# Patient Record
Sex: Male | Born: 1940 | Race: White | Hispanic: No | Marital: Married | Smoking: Never smoker
Health system: Southern US, Community
[De-identification: ages and names within clinical notes are randomized; demographics above are authoritative.]

## PROBLEM LIST (undated history)

## (undated) DIAGNOSIS — K259 Gastric ulcer, unspecified as acute or chronic, without hemorrhage or perforation: Secondary | ICD-10-CM

## (undated) DIAGNOSIS — E785 Hyperlipidemia, unspecified: Secondary | ICD-10-CM

## (undated) DIAGNOSIS — E669 Obesity, unspecified: Secondary | ICD-10-CM

## (undated) DIAGNOSIS — E119 Type 2 diabetes mellitus without complications: Secondary | ICD-10-CM

## (undated) DIAGNOSIS — L570 Actinic keratosis: Secondary | ICD-10-CM

## (undated) HISTORY — DX: Gastric ulcer, unspecified as acute or chronic, without hemorrhage or perforation: K25.9

## (undated) HISTORY — DX: Hyperlipidemia, unspecified: E78.5

## (undated) HISTORY — PX: TONSILLECTOMY: SUR1361

## (undated) HISTORY — DX: Actinic keratosis: L57.0

## (undated) HISTORY — PX: TREATMENT FISTULA ANAL: SUR1390

## (undated) HISTORY — PX: KNEE ARTHROSCOPY: SHX127

---
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
## 2003-03-18 ENCOUNTER — Other Ambulatory Visit: Payer: Self-pay

## 2007-02-02 ENCOUNTER — Emergency Department: Payer: Self-pay | Admitting: Emergency Medicine

## 2007-02-13 ENCOUNTER — Emergency Department: Payer: Self-pay | Admitting: Unknown Physician Specialty

## 2008-06-22 ENCOUNTER — Ambulatory Visit: Payer: Self-pay | Admitting: Specialist

## 2008-07-14 ENCOUNTER — Ambulatory Visit: Payer: Self-pay | Admitting: Specialist

## 2008-07-22 ENCOUNTER — Ambulatory Visit: Payer: Self-pay | Admitting: Specialist

## 2013-05-12 DIAGNOSIS — C4492 Squamous cell carcinoma of skin, unspecified: Secondary | ICD-10-CM

## 2013-05-12 HISTORY — DX: Squamous cell carcinoma of skin, unspecified: C44.92

## 2017-01-24 ENCOUNTER — Other Ambulatory Visit: Payer: Self-pay | Admitting: Internal Medicine

## 2017-01-24 DIAGNOSIS — M5416 Radiculopathy, lumbar region: Secondary | ICD-10-CM

## 2017-01-26 ENCOUNTER — Other Ambulatory Visit: Payer: Self-pay | Admitting: Internal Medicine

## 2017-01-26 ENCOUNTER — Ambulatory Visit
Admission: RE | Admit: 2017-01-26 | Discharge: 2017-01-26 | Disposition: A | Discharge status: Home / Self Care | Payer: Medicare Other | Source: Ambulatory Visit | Attending: Internal Medicine | Admitting: Internal Medicine

## 2017-01-26 DIAGNOSIS — M5416 Radiculopathy, lumbar region: Secondary | ICD-10-CM | POA: Diagnosis present

## 2017-01-26 DIAGNOSIS — M48061 Spinal stenosis, lumbar region without neurogenic claudication: Secondary | ICD-10-CM | POA: Diagnosis not present

## 2017-01-26 DIAGNOSIS — M2578 Osteophyte, vertebrae: Secondary | ICD-10-CM | POA: Diagnosis not present

## 2017-01-26 DIAGNOSIS — M4186 Other forms of scoliosis, lumbar region: Secondary | ICD-10-CM | POA: Diagnosis not present

## 2017-01-26 DIAGNOSIS — M5126 Other intervertebral disc displacement, lumbar region: Secondary | ICD-10-CM | POA: Diagnosis not present

## 2017-01-26 LAB — POCT I-STAT CREATININE: CREATININE: 1.6 mg/dL — AB (ref 0.61–1.24)

## 2017-02-04 ENCOUNTER — Ambulatory Visit
Admission: RE | Admit: 2017-02-04 | Discharge: 2017-02-04 | Disposition: A | Discharge status: Home / Self Care | Payer: Medicare Other | Source: Ambulatory Visit | Attending: Student in an Organized Health Care Education/Training Program | Admitting: Student in an Organized Health Care Education/Training Program

## 2017-02-04 ENCOUNTER — Encounter: Payer: Self-pay | Admitting: Student in an Organized Health Care Education/Training Program

## 2017-02-04 ENCOUNTER — Ambulatory Visit
Payer: Medicare Other | Attending: Student in an Organized Health Care Education/Training Program | Admitting: Student in an Organized Health Care Education/Training Program

## 2017-02-04 VITALS — BP 156/95 | HR 67 | Temp 97.8°F | Resp 24 | Ht 68.0 in | Wt 230.0 lb

## 2017-02-04 DIAGNOSIS — M48061 Spinal stenosis, lumbar region without neurogenic claudication: Secondary | ICD-10-CM | POA: Diagnosis not present

## 2017-02-04 DIAGNOSIS — M5416 Radiculopathy, lumbar region: Secondary | ICD-10-CM | POA: Insufficient documentation

## 2017-02-04 DIAGNOSIS — M47816 Spondylosis without myelopathy or radiculopathy, lumbar region: Secondary | ICD-10-CM | POA: Insufficient documentation

## 2017-02-04 DIAGNOSIS — M545 Low back pain: Secondary | ICD-10-CM | POA: Diagnosis present

## 2017-02-04 DIAGNOSIS — M5116 Intervertebral disc disorders with radiculopathy, lumbar region: Secondary | ICD-10-CM | POA: Insufficient documentation

## 2017-02-04 DIAGNOSIS — M4726 Other spondylosis with radiculopathy, lumbar region: Secondary | ICD-10-CM | POA: Diagnosis present

## 2017-02-04 DIAGNOSIS — M5441 Lumbago with sciatica, right side: Secondary | ICD-10-CM | POA: Insufficient documentation

## 2017-02-04 MED ORDER — SODIUM CHLORIDE 0.9 % IJ SOLN
INTRAMUSCULAR | Status: AC
  Filled 2017-02-04: qty 10

## 2017-02-04 MED ORDER — IOPAMIDOL (ISOVUE-M 200) INJECTION 41%
10.0000 mL | Freq: Once | INTRAMUSCULAR | Status: AC
  Administered 2017-02-04: 20 mL via EPIDURAL
  Filled 2017-02-04: qty 10

## 2017-02-04 MED ORDER — LIDOCAINE HCL (PF) 1 % IJ SOLN
10.0000 mL | Freq: Once | INTRAMUSCULAR | Status: AC
  Administered 2017-02-04: 5 mL
  Filled 2017-02-04: qty 10

## 2017-02-04 MED ORDER — ROPIVACAINE HCL 2 MG/ML IJ SOLN
2.0000 mL | Freq: Once | INTRAMUSCULAR | Status: AC
  Administered 2017-02-04: 10 mL via EPIDURAL
  Filled 2017-02-04: qty 10

## 2017-02-04 MED ORDER — DEXAMETHASONE SODIUM PHOSPHATE 10 MG/ML IJ SOLN
10.0000 mg | Freq: Once | INTRAMUSCULAR | Status: AC
  Administered 2017-02-04: 10 mg
  Filled 2017-02-04: qty 1

## 2017-02-04 MED ORDER — SODIUM CHLORIDE 0.9% FLUSH
2.0000 mL | Freq: Once | INTRAVENOUS | Status: AC
  Administered 2017-02-04: 10 mL

## 2017-02-04 NOTE — Progress Notes (Addendum)
Patient's Name: Larry Shaffer  MRN: [id number]  Referring Provider: Tracie Harrier, MD  DOB: 1940/06/25  PCP: Tracie Harrier, MD  DOS: 02/04/2017  Note by: Gillis Santa, MD  Service setting: Ambulatory outpatient  Specialty: Interventional Pain Management  Location: [facility] (AMB) Pain Management Facility    Patient type: New patient ("FAST-TRACK" Evaluation)   Warning: This referral option does not include the extensive pharmacological evaluation required for [place] to take over the patient's medication management. The "Fast-Track" system is designed to bypass the new patient referral waiting list, as well as the normal patient evaluation process, in order to provide a patient in distress with a timely pain management intervention. Because the system was not designed to unfairly get a patient into our pain practice ahead of those already waiting, certain restrictions apply. By requesting a "Fast-Track" consult, the referring physician has opted to continue managing the patient's medications in order to get interventional urgent care.  Primary Reason for Visit: Interventional Pain Management Treatment. CC: Back Pain (lower right)   Procedure  HPI  Larry Shaffer is a 76 y.o. year old, male patient, who comes today for a  "Fast-Track" new patient evaluation, as requested by Tracie Harrier, MD. The patient has been made aware that this type of referral option is reserved for the Interventional Pain Management portion of our practice and completely excludes the option of medication management. His primarily concern today is the Back Pain (lower right)  Pain Assessment: Location: Lower, Right Back Radiating: down the buttocks and back of the right leg to approx just above the knee Onset: More than a month ago Duration: Chronic pain Quality: Spasm, Sharp, Radiating, Shooting, Constant Severity: 8  (when standing)/10 (self-reported pain score)  Note: Reported level is compatible with observation.                    When using our objective Pain Scale, levels between 6 and 10/10 are said to belong in an emergency room, as it progressively worsens from a 6/10, described as severely limiting, requiring emergency care not usually available at an outpatient pain management facility. At a 6/10 level, communication becomes difficult and requires great effort. Assistance to reach the emergency department may be required. Facial flushing and profuse sweating along with potentially dangerous increases in heart rate and blood pressure will be evident. Effect on ADL: difficult to walk without stooping over.  Timing: Constant Modifying factors: sitting and aleve.   Onset and Duration: Date of onset: 01/11/2017 Cause of pain: lifting heavy boxes at home Severity: Getting worse, NAS-11 at its best: 8/10 and NAS-11 now: 8/10 Timing: Not influenced by the time of the day and After a period of immobility Aggravating Factors: Prolonged standing and Walking Alleviating Factors: Resting, Sitting, Warm showers or baths and Walking Associated Problems: Spasms and Pain that wakes patient up Quality of Pain: Aching, Deep, Disabling, Feeling of constriction, Pressure-like and Stabbing Previous Examinations or Tests: MRI scan and Neurosurgical evaluation Previous Treatments: Steroid treatments by mouth  The patient comes into the clinic today, referred to [place] for a right L4-L5 lumbar epidural steroid injection.  Patient presents with low back pain that radiates to his right buttock and posterior thigh region. Pain started approximately 1 month ago when he was lifting heavy items and felt a sharp for pain sensation in his back. This worsened on his trip to [place] for work. Patient's pain stops in the posterior thigh around his knee and rarely extends below his knee. He  believes that his pain symptoms have improved somewhat over the last month. No bowel or bladder deficits. No obvious weakness.  Patient has tried a  steroid taper along with muscle relaxants, NSAIDs, Tylenol. These were not very helpful. Of note patient did have a flu vaccination the last week. Discussed the potential risks of having an epidural steroid injection done in the context of having a flu shot in the last week. Patient insists that his low back and leg pain is very debilitating and that he is willing to proceed with the epidural steroid injection understanding the risks associated in the context of having received a flu vaccination last week.  Meds   Current Outpatient Prescriptions:  .  atorvastatin (LIPITOR) 20 MG tablet, Take 1 tablet by mouth daily., Disp: , Rfl:  .  famciclovir (FAMVIR) 500 MG tablet, Take 1 tablet by mouth 2 (two) times daily as needed., Disp: , Rfl:  .  glipiZIDE (GLUCOTROL XL) 10 MG 24 hr tablet, Take 10 mg by mouth daily., Disp: , Rfl:  .  pantoprazole (PROTONIX) 40 MG tablet, Take 1 tablet by mouth daily., Disp: , Rfl:  .  sucralfate (CARAFATE) 1 g tablet, Take 1 g by mouth 4 (four) times daily., Disp: , Rfl:  .  tamsulosin (FLOMAX) 0.4 MG CAPS capsule, Take 0.4 mg by mouth daily., Disp: , Rfl:   Imaging Review   Lumbosacral Imaging: Lumbar MR wo contrast:  Results for orders placed during the hospital encounter of 01/26/17  MR LUMBAR SPINE WO CONTRAST   Narrative CLINICAL DATA:  Lifting injury 2 weeks ago. Low back pain radiating into the right hip, groin and posterior thigh.  EXAM: MRI LUMBAR SPINE WITHOUT CONTRAST  TECHNIQUE: Multiplanar, multisequence MR imaging of the lumbar spine was performed. No intravenous contrast was administered.  COMPARISON:  None.  FINDINGS: Segmentation: Conventional anatomy assumed, with the last open disc space designated L5-S1.  Alignment: Mild convex left scoliosis and slight degenerative anterolisthesis at L4-5.  Vertebrae: No worrisome osseous lesion, acute fracture or pars defect. There are scattered endplate degenerative changes. The lumbar  pedicles are somewhat short on a congenital basis. The right sacroiliac joint may be partial ankylosis.  Conus medullaris: Extends to the L1 level and appears normal.  Paraspinal and other soft tissues: No significant paraspinal findings. The bladder appears distended.  Disc levels:  No significant disc space findings from T11-12 through L1-2.  L2-3: Mild loss of disc height with annular disc bulging and endplate osteophytes. No significant spinal stenosis or nerve root encroachment.  L3-4: Advanced loss of disc height with annular disc bulging and endplate osteophytes. There is an extraforaminal disc osteophyte complex on the right causing right L3 nerve root encroachment. There is mild facet and ligamentous hypertrophy. There is borderline spinal stenosis with mild narrowing of the lateral recesses. The left foramen is patent.  L4-5: Loss of disc height with annular disc bulging and moderate to advanced facet and ligamentous hypertrophy. There is resulting moderate multifactorial spinal stenosis with moderate narrowing of the lateral recesses and foramina bilaterally.  L5-S1: Loss of disc height with annular disc bulging and endplate osteophytes asymmetric to the left. Mild facet and ligamentous hypertrophy. The spinal canal and lateral recesses are patent. There is mild right and moderate left foraminal narrowing.  IMPRESSION: 1. Extraforaminal disc protrusion on the right at L3-4 appears to have covering spur but may contribute to right L3 nerve root encroachment and the patient's symptoms. 2. 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 3. Mild right and moderate left foraminal narrowing at L5-S1 secondary to endplate osteophytes.   Electronically Signed   By: Carey Bullocks M.D.   On: 01/26/2017 13:18     Knee-L MR w contrast:  Results for orders placed in visit on 06/22/08  MR Knee  Left  Wo Contrast   Narrative   PRIOR REPORT IMPORTED FROM THE [facility] WORKFLOW SYSTEM   REASON FOR EXAM:    left knee pain  COMMENTS:  [id number]   PROCEDURE:     MR  - MR KNEE LT  WO CONTRAST  - Jun 22 2008  8:28AM   RESULT:     Multiplanar/multisequence imaging of the LEFT knee was  obtained  without the administration of gadolinium.   The subpatellar cartilage is intact. A small/trace subpatella effusion is  identified. A small loculated area of increased T2 signal projects within  the medial popliteal fossa. The osseous structures demonstrate no T1 nor  T2  signal abnormalities. The neurovascular bundle is intact. The medial  collateral ligament demonstrates a small amount of increased T2 signal  deep  to the ligament as well as projecting superficially to the ligament. The  architecture of the ligament is intact. The lateral collateral ligamentous  complex, quadriceps tendon and patellar ligaments are intact. Evaluation  of  the menisci demonstrates an area of increased T1 and T2 signal within the  posterior horn of the medial meniscus demonstrating articular  communication.  The anterior and posterior cruciate ligaments are intact.   IMPRESSION:   1. Findings concerning for a tear within the posterior horn of the medial  meniscus.   2. Small subpatella effusion.   3. Element of meniscocapsular separation within the medial compartment.   Thank you for the opportunity to contribute to the care of your patient.       Complexity Note: Imaging results reviewed. Results shared with Larry Shaffer, using Layman's terms.                         ROS  Cardiovascular History: No reported cardiovascular signs or symptoms such as High blood pressure, coronary artery disease, abnormal heart rate or rhythm, heart attack, blood thinner therapy or heart weakness and/or failure Pulmonary or Respiratory History: Snoring  Neurological History: No reported neurological signs or symptoms  such as seizures, abnormal skin sensations, urinary and/or fecal incontinence, being born with an abnormal open spine and/or a tethered spinal cord Review of Past Neurological Studies: No results found for this or any previous visit. Psychological-Psychiatric History: No reported psychological or psychiatric signs or symptoms such as difficulty sleeping, anxiety, depression, delusions or hallucinations (schizophrenial), mood swings (bipolar disorders) or suicidal ideations or attempts Gastrointestinal History: Vomiting blood (Ulcers) Genitourinary History: No reported renal or genitourinary signs or symptoms such as difficulty voiding or producing urine, peeing blood, non-functioning kidney, kidney stones, difficulty emptying the bladder, difficulty controlling the flow of urine, or chronic kidney disease Hematological History: No reported hematological signs or symptoms such as prolonged bleeding, low or poor functioning platelets, bruising or bleeding easily, hereditary bleeding problems, low energy levels due to low hemoglobin or being anemic Endocrine History: High blood sugar requiring insulin (IDDM) Rheumatologic History: No reported rheumatological signs and symptoms such as fatigue, joint pain, tenderness, swelling, redness, heat, stiffness, decreased range of motion, with or without associated rash Musculoskeletal History: Negative for myasthenia gravis, muscular dystrophy, multiple sclerosis or malignant hyperthermia  Work History: Working full time and Quit going to work on his/her own  Allergies  Larry Shaffer has No Known Allergies.  Laboratory Chemistry  Inflammation Markers (CRP: Acute Phase) (ESR: Chronic Phase) No results found for: CRP, ESRSEDRATE               Renal Function Markers Lab Results  Component Value Date   CREATININE 1.60 (H) 01/26/2017                 Hepatic Function Markers No results found for: AST, ALT, ALBUMIN, ALKPHOS, HCVAB                Electrolytes No results found for: NA, K, CL, CALCIUM, MG               Neuropathy Markers No results found for: [id number]               Bone Pathology Markers No results found for: Hendricks Milo, VD125OH2TOT, [id number], [id number], 25OHVITD1, 25OHVITD2, 25OHVITD3, CALCIUM, TESTOFREE, TESTOSTERONE               Coagulation Parameters No results found for: INR, LABPROT, APTT, PLT               Cardiovascular Markers No results found for: BNP, HGB, HCT               Note: Lab results reviewed.  Alger  Drug: Larry Shaffer  reports that he does not use drugs. Alcohol:  reports that he drinks alcohol. Tobacco:  reports that he has never smoked. He has never used smokeless tobacco. Medical:  has a past medical history of Gastric ulcer and Hyperlipidemia. Family: family history includes Cancer in his sister; Diabetes in his mother; Heart disease in his father; Stroke in his mother.  Past Surgical History:  Procedure Laterality Date  . KNEE ARTHROSCOPY Bilateral 2003, 2004  . tonsillectomty  1947   Active Ambulatory Problems    Diagnosis Date Noted  . Lumbar radiculopathy 02/04/2017  . 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 Spinal stenosis of lumbar region without neurogenic claudication 02/04/2017  . Acute right-sided low back pain with right-sided sciatica 02/04/2017   Resolved Ambulatory Problems    Diagnosis Date Noted  . No Resolved Ambulatory Problems   Past Medical History:  Diagnosis Date  . Gastric ulcer   . Hyperlipidemia    Constitutional Exam  General appearance: Well nourished, well developed, and well hydrated. In no apparent acute distress Vitals:   02/04/17 1306 02/04/17 1310 02/04/17 1314 02/04/17 1316  BP: (!) 166/95 (!) 166/129 (!) 166/100 (!) 156/95  Pulse:      Resp: 19 (!) 22 (!) 21 (!) 24  Temp:      TempSrc:      SpO2: 98% 100% 100% 100%  Weight:      Height:       BMI Assessment: Estimated body mass  index is 34.97 kg/m as calculated from the following:   Height as of this encounter: '5\' 8"'$  (1.727 m).   Weight as of this encounter: 230 lb (104.3 kg).  BMI interpretation table: BMI level Category Range association with higher incidence of chronic pain  <18 kg/m2 Underweight   18.5-24.9 kg/m2 Ideal body weight   25-29.9 kg/m2 Overweight Increased incidence by 20%  30-34.9 kg/m2 Obese (Class I) Increased incidence by 68%  35-39.9 kg/m2 Severe obesity (Class II) Increased incidence by 136%  >40 kg/m2 Extreme obesity (Class III) Increased incidence by  254%   BMI Readings from Last 4 Encounters:  02/04/17 34.97 kg/m   Wt Readings from Last 4 Encounters:  02/04/17 230 lb (104.3 kg)  Psych/Mental status: Alert, oriented x 3 (person, place, & time)       Eyes: PERLA Respiratory: No evidence of acute respiratory distress  Lumbar Spine Area Exam  Skin & Axial Inspection: No masses, redness, or swelling Alignment: Symmetrical Functional ROM: Unrestricted ROM      Stability: No instability detected Muscle Tone/Strength: Functionally intact. No obvious neuro-muscular anomalies detected. Sensory (Neurological): Unimpaired Palpation: No palpable anomalies       Provocative Tests: Lumbar Hyperextension and rotation test: Positive bilaterally for facet joint pain. right greater than left. Lumbar Lateral bending test: Positive ipsilateral radicular pain, bilaterally. Positive for bilateral foraminal stenosis.right greater than left. Patrick's Maneuver: evaluation deferred today                    Gait & Posture Assessment  Ambulation: Limited Gait: Antalgic Posture: WNL   Lower Extremity Exam    Side: Right lower extremity  Side: Left lower extremity  Skin & Extremity Inspection: Skin color, temperature, and hair growth are WNL. No peripheral edema or cyanosis. No masses, redness, swelling, asymmetry, or associated skin lesions. No contractures.  Skin & Extremity Inspection: Skin color,  temperature, and hair growth are WNL. No peripheral edema or cyanosis. No masses, redness, swelling, asymmetry, or associated skin lesions. No contractures.  Functional ROM: Unrestricted ROM          Functional ROM: Unrestricted ROM          Muscle Tone/Strength: Functionally intact. No obvious neuro-muscular anomalies detected.  Muscle Tone/Strength: Functionally intact. No obvious neuro-muscular anomalies detected.  Sensory (Neurological): Unimpaired  Sensory (Neurological): Unimpaired  Palpation: No palpable anomalies  Palpation: No palpable anomalies     Assessment and plan: -76 year old gentleman with history of axial low back pain that radiates into his right leg (right buttock and right posterior thigh stopping at his knee).  Patient's lumbar MRI shows extraforaminal disc protrusion on the right at L3-L4 compressing the right L3 nerve root along with facet and ligamentous hypertrophy. Patient also has moderate multifactorial spinal stenosis at L4-L5 with moderate narrowing of the lateral recess and foramina bilaterally.  Plan today to do interlaminar L4-L5 right-sided epidural steroid injection under fluoroscopy. Patient is not on any blood thinners. Understands risks and benefits of the procedure. See procedure note.

## 2017-02-04 NOTE — Progress Notes (Signed)
Safety precautions to be maintained throughout the outpatient stay will include: orient to surroundings, keep bed in low position, maintain call bell within reach at all times, provide assistance with transfer out of bed and ambulation.  

## 2017-02-04 NOTE — Progress Notes (Signed)
Patient's Name: Larry Shaffer  MRN: [id number]  Referring Provider: Tracie Harrier, MD  DOB: 11-19-1940  PCP: Tracie Harrier, MD  DOS: 02/04/2017  Note by: Gillis Santa, MD  Service setting: Ambulatory outpatient  Specialty: Interventional Pain Management  Patient type: Established  Location: [facility] (AMB) Pain Management Facility  Visit type: Interventional Procedure   Primary Reason for Visit: Interventional Pain Management Treatment. CC: Back Pain (lower right)  Procedure:  Anesthesia, Analgesia, Anxiolysis:  Type: Therapeutic Inter-Laminar Epidural Steroid Injection #1 Region: Lumbar Level: L4-5 Level. Laterality: Right-Sided         Type: Local Anesthesia Local Anesthetic: Lidocaine 1% Route: Infiltration (Thunderbolt/IM) IV Access: Secured Sedation: Meaningful verbal contact was maintained at all times during the procedure  Indication(s): Analgesia and Anxiety   Indications: 1. Lumbar radiculopathy   2. Spondylosis without myelopathy or radiculopathy, lumbar region   3. Spinal stenosis of lumbar region without neurogenic claudication   4. Acute right-sided low back pain with right-sided sciatica    Pain Score: Pre-procedure: 8  (when standing)/10 Post-procedure: 8 /10  Pre-op Assessment:  Larry Shaffer is a 76 y.o. (year old), male patient, seen today for interventional treatment. He  has a past surgical history that includes Knee arthroscopy (Bilateral, 2003, 2004) and tonsillectomty (1947). Larry Shaffer has a current medication list which includes the following prescription(s): atorvastatin, famciclovir, glipizide, pantoprazole, sucralfate, and tamsulosin. His primarily concern today is the Back Pain (lower right)  Initial Vital Signs: Blood pressure (!) 144/77, pulse 67, temperature 97.8 F (36.6 C), temperature source Oral, resp. rate 16, height 5\' 8"  (1.727 m), weight 230 lb (104.3 kg), SpO2 97 %. BMI: Estimated body mass index is 34.97 kg/m as calculated from the following:    Height as of this encounter: 5\' 8"  (1.727 m).   Weight as of this encounter: 230 lb (104.3 kg).  Risk Assessment: Allergies: Reviewed. He has No Known Allergies.  Allergy Precautions: None required Coagulopathies: Reviewed. None identified.  Blood-thinner therapy: None at this time Active Infection(s): Reviewed. None identified. Larry Shaffer is afebrile  Site Confirmation: Larry Shaffer was asked to confirm the procedure and laterality before marking the site Procedure checklist: Completed Consent: Before the procedure and under the influence of no sedative(s), amnesic(s), or anxiolytics, the patient was informed of the treatment options, risks and possible complications. To fulfill our ethical and legal obligations, as recommended by the American Medical Association's Code of Ethics, I have informed the patient of my clinical impression; the nature and purpose of the treatment or procedure; the risks, benefits, and possible complications of the intervention; the alternatives, including doing nothing; the risk(s) and benefit(s) of the alternative treatment(s) or procedure(s); and the risk(s) and benefit(s) of doing nothing. The patient was provided information about the general risks and possible complications associated with the procedure. These may include, but are not limited to: failure to achieve desired goals, infection, bleeding, organ or nerve damage, allergic reactions, paralysis, and death. In addition, the patient was informed of those risks and complications associated to Spine-related procedures, such as failure to decrease pain; infection (i.e.: Meningitis, epidural or intraspinal abscess); bleeding (i.e.: epidural hematoma, subarachnoid hemorrhage, or any other type of intraspinal or peri-dural bleeding); organ or nerve damage (i.e.: Any type of peripheral nerve, nerve root, or spinal cord injury) with subsequent damage to sensory, motor, and/or autonomic systems, resulting in permanent  pain, numbness, and/or weakness of one or several areas of the body; allergic reactions; (i.e.: anaphylactic reaction); and/or death. Furthermore, the patient was  informed of those risks and complications associated with the medications. These include, but are not limited to: allergic reactions (i.e.: anaphylactic or anaphylactoid reaction(s)); adrenal axis suppression; blood sugar elevation that in diabetics may result in ketoacidosis or comma; water retention that in patients with history of congestive heart failure may result in shortness of breath, pulmonary edema, and decompensation with resultant heart failure; weight gain; swelling or edema; medication-induced neural toxicity; particulate matter embolism and blood vessel occlusion with resultant organ, and/or nervous system infarction; and/or aseptic necrosis of one or more joints. Finally, the patient was informed that Medicine is not an exact science; therefore, there is also the possibility of unforeseen or unpredictable risks and/or possible complications that may result in a catastrophic outcome. The patient indicated having understood very clearly. We have given the patient no guarantees and we have made no promises. Enough time was given to the patient to ask questions, all of which were answered to the patient's satisfaction. Larry Shaffer has indicated that he wanted to continue with the procedure. Attestation: I, the ordering provider, attest that I have discussed with the patient the benefits, risks, side-effects, alternatives, likelihood of achieving goals, and potential problems during recovery for the procedure that I have provided informed consent. Date: 02/04/2017; Time: 12:43 PM  Pre-Procedure Preparation:  Monitoring: As per clinic protocol. Respiration, ETCO2, SpO2, BP, heart rate and rhythm monitor placed and checked for adequate function Safety Precautions: Patient was assessed for positional comfort and pressure points before  starting the procedure. Time-out: I initiated and conducted the "Time-out" before starting the procedure, as per protocol. The patient was asked to participate by confirming the accuracy of the "Time Out" information. Verification of the correct person, site, and procedure were performed and confirmed by me, the nursing staff, and the patient. "Time-out" conducted as per Joint Commission's Universal Protocol (UP.01.01.01). "Time-out" Date & Time: 02/04/2017; 1258 hrs.  Description of Procedure Process:   Position: Prone with head of the table was raised to facilitate breathing. Target Area: The interlaminar space, initially targeting the lower laminar border of the superior vertebral body. Approach: Paramedial approach. Area Prepped: Entire Posterior Lumbar Region Prepping solution: ChloraPrep (2% chlorhexidine gluconate and 70% isopropyl alcohol) Safety Precautions: Aspiration looking for blood return was conducted prior to all injections. At no point did we inject any substances, as a needle was being advanced. No attempts were made at seeking any paresthesias. Safe injection practices and needle disposal techniques used. Medications properly checked for expiration dates. SDV (single dose vial) medications used. Description of the Procedure: Protocol guidelines were followed. The procedure needle was introduced through the skin, ipsilateral to the reported pain, and advanced to the target area. Bone was contacted and the needle walked caudad, until the lamina was cleared. The epidural space was identified using "loss-of-resistance technique" with 2-3 ml of PF-NaCl (0.9% NSS), in a 5cc LOR glass syringe. Vitals:   02/04/17 1306 02/04/17 1310 02/04/17 1314 02/04/17 1316  BP: (!) 166/95 (!) 166/129 (!) 166/100 (!) 156/95  Pulse:      Resp: 19 (!) 22 (!) 21 (!) 24  Temp:      TempSrc:      SpO2: 98% 100% 100% 100%  Weight:      Height:        Start Time: 1258 hrs. End Time: 1316 hrs. Materials:   Needle(s) Type: Epidural needle Gauge: 17G Length: 3.5-in Medication(s): We administered iopamidol, ropivacaine (PF) 2 mg/mL (0.2%), sodium chloride flush, lidocaine (PF), and dexamethasone. Please see  chart orders for dosing details. 5 cc of preservative-free normal saline, 2 cc of 0.2% ropivacaine, 1 cc of Decadron 10 mg/cc: 8 cc total Imaging Guidance (Spinal):  Type of Imaging Technique: Fluoroscopy Guidance (Spinal) Indication(s): Assistance in needle guidance and placement for procedures requiring needle placement in or near specific anatomical locations not easily accessible without such assistance. Exposure Time: Please see nurses notes. Contrast: Before injecting any contrast, we confirmed that the patient did not have an allergy to iodine, shellfish, or radiological contrast. Once satisfactory needle placement was completed at the desired level, radiological contrast was injected. Contrast injected under live fluoroscopy. No contrast complications. See chart for type and volume of contrast used. Fluoroscopic Guidance: I was personally present during the use of fluoroscopy. "Tunnel Vision Technique" used to obtain the best possible view of the target area. Parallax error corrected before commencing the procedure. "Direction-depth-direction" technique used to introduce the needle under continuous pulsed fluoroscopy. Once target was reached, antero-posterior, oblique, and lateral fluoroscopic projection used confirm needle placement in all planes. Images permanently stored in EMR. Interpretation: I personally interpreted the imaging intraoperatively. Adequate needle placement confirmed in multiple planes. Appropriate spread of contrast into desired area was observed. No evidence of afferent or efferent intravascular uptake. No intrathecal or subarachnoid spread observed. Permanent images saved into the patient's record.  Antibiotic Prophylaxis:  Indication(s): None identified Antibiotic  given: None  Post-operative Assessment:  EBL: None Complications: No immediate post-treatment complications observed by team, or reported by patient. Note: The patient tolerated the entire procedure well. A repeat set of vitals were taken after the procedure and the patient was kept under observation following institutional policy, for this type of procedure. Post-procedural neurological assessment was performed, showing return to baseline, prior to discharge. The patient was provided with post-procedure discharge instructions, including a section on how to identify potential problems. Should any problems arise concerning this procedure, the patient was given instructions to immediately contact us, at any time, without hesitation. In any case, we plan to contact the patient by telephone for a follow-up status report regarding this interventional procedure. Comments:  No additional relevant information. 5 out of 5 strength bilateral lower extremity: Plantar flexion, dorsiflexion, knee flexion, knee extension. Plan of Care    Imaging Orders     DG C-Arm 1-60 Min-No Report Procedure Orders    No procedure(s) ordered today    Medications ordered for procedure: Meds ordered this encounter  Medications  . iopamidol (ISOVUE-M) 41 % intrathecal injection 10 mL  . ropivacaine (PF) 2 mg/mL (0.2%) (NAROPIN) injection 2 mL  . sodium chloride flush (NS) 0.9 % injection 2 mL  . lidocaine (PF) (XYLOCAINE) 1 % injection 10 mL  . dexamethasone (DECADRON) injection 10 mg   Medications administered: We administered iopamidol, ropivacaine (PF) 2 mg/mL (0.2%), sodium chloride flush, lidocaine (PF), and dexamethasone.  See the medical record for exact dosing, route, and time of administration.  New Prescriptions   No medications on file   Disposition: Discharge home  Discharge Date & Time: 02/04/2017; 1328 hrs.   Physician-requested Follow-up: Return in about 2 weeks (around 02/18/2017) for Post  Procedure Evaluation. Future Appointments Date Time Provider [place]  03/07/2017 11:30 AM Gillis Santa, MD [facility] None   Primary Care Physician: Tracie Harrier, MD Location: [facility] Note by: Gillis Santa, MD Date: 02/04/2017; Time: 2:18 PM  Disclaimer:  Medicine is not an exact science. The only guarantee in medicine is that nothing is guaranteed. It is important  to note that the decision to proceed with this intervention was based on the information collected from the patient. The Data and conclusions were drawn from the patient's questionnaire, the interview, and the physical examination. Because the information was provided in large part by the patient, it cannot be guaranteed that it has not been purposely or unconsciously manipulated. Every effort has been made to obtain as much relevant data as possible for this evaluation. It is important to note that the conclusions that lead to this procedure are derived in large part from the available data. Always take into account that the treatment will also be dependent on availability of resources and existing treatment guidelines, considered by other Pain Management Practitioners as being common knowledge and practice, at the time of the intervention. For Medico-Legal purposes, it is also important to point out that variation in procedural techniques and pharmacological choices are the acceptable norm. The indications, contraindications, technique, and results of the above procedure should only be interpreted and judged by a Board-Certified Interventional Pain Specialist with extensive familiarity and expertise in the same exact procedure and technique.

## 2017-02-04 NOTE — Patient Instructions (Signed)

## 2017-03-07 ENCOUNTER — Ambulatory Visit: Payer: Medicare Other | Admitting: Student in an Organized Health Care Education/Training Program

## 2017-03-11 ENCOUNTER — Ambulatory Visit
Payer: Medicare Other | Attending: Student in an Organized Health Care Education/Training Program | Admitting: Student in an Organized Health Care Education/Training Program

## 2017-03-11 ENCOUNTER — Encounter: Payer: Self-pay | Admitting: Student in an Organized Health Care Education/Training Program

## 2017-03-11 ENCOUNTER — Other Ambulatory Visit: Payer: Self-pay

## 2017-03-11 VITALS — BP 152/69 | HR 65 | Temp 97.5°F | Ht 68.0 in | Wt 230.0 lb

## 2017-03-11 DIAGNOSIS — E785 Hyperlipidemia, unspecified: Secondary | ICD-10-CM | POA: Insufficient documentation

## 2017-03-11 DIAGNOSIS — Z8719 Personal history of other diseases of the digestive system: Secondary | ICD-10-CM | POA: Diagnosis not present

## 2017-03-11 DIAGNOSIS — Z7984 Long term (current) use of oral hypoglycemic drugs: Secondary | ICD-10-CM | POA: Diagnosis not present

## 2017-03-11 DIAGNOSIS — Z79899 Other long term (current) drug therapy: Secondary | ICD-10-CM | POA: Diagnosis not present

## 2017-03-11 DIAGNOSIS — M5416 Radiculopathy, lumbar region: Secondary | ICD-10-CM | POA: Diagnosis not present

## 2017-03-11 DIAGNOSIS — M5126 Other intervertebral disc displacement, lumbar region: Secondary | ICD-10-CM | POA: Diagnosis not present

## 2017-03-11 DIAGNOSIS — M48061 Spinal stenosis, lumbar region without neurogenic claudication: Secondary | ICD-10-CM

## 2017-03-11 DIAGNOSIS — M47816 Spondylosis without myelopathy or radiculopathy, lumbar region: Secondary | ICD-10-CM

## 2017-03-11 DIAGNOSIS — M545 Low back pain: Secondary | ICD-10-CM | POA: Diagnosis present

## 2017-03-11 DIAGNOSIS — M4726 Other spondylosis with radiculopathy, lumbar region: Secondary | ICD-10-CM | POA: Insufficient documentation

## 2017-03-11 MED ORDER — BACLOFEN 10 MG PO TABS: 10.0000 mg | ORAL_TABLET | Freq: Three times a day (TID) | ORAL | 0 refills | Status: DC

## 2017-03-11 NOTE — Patient Instructions (Addendum)
1. Schedule for repeat lumbar epidural epidural steroid 2. Baclofen 10 mg up to three times a day as needed for muscle spasms   Preparing for your procedure (without sedation) Instructions: . Oral Intake: Do not eat or drink anything for at least 3 hours prior to your procedure. . Transportation: Unless otherwise stated by your physician, you may drive yourself after the procedure. . Blood Pressure Medicine: Take your blood pressure medicine with a sip of water the morning of the procedure. . Insulin: Take only  of your normal insulin dose. . Preventing infections: Shower with an antibacterial soap the morning of your procedure. . Build-up your immune system: Take 1000 mg of Vitamin C with every meal (3 times a day) the day prior to your procedure. . Pregnancy: If you are pregnant, call and cancel the procedure. . Sickness: If you have a cold, fever, or any active infections, call and cancel the procedure. . Arrival: You must be in the facility at least 30 minutes prior to your scheduled procedure. . Children: Do not bring any children with you. . Dress appropriately: Bring dark clothing that you would not mind if they get stained. . Valuables: Do not bring any jewelry or valuables. Procedure appointments are reserved for interventional treatments only. Marland Kitchen No Prescription Refills. . No medication changes will be discussed during procedure appointments. . No disability issues will be discussed.

## 2017-03-11 NOTE — Progress Notes (Signed)
Patient's Name: Larry Shaffer  MRN: [id number]  Referring Provider: Tracie Harrier, MD  DOB: [birth year]  PCP: Tracie Harrier, MD  DOS: 03/11/2017  Note by: Gillis Santa, MD  Service setting: Ambulatory outpatient  Specialty: Interventional Pain Management  Location: [facility] (AMB) Pain Management Facility    Patient type: Established   Primary Reason(s) for Visit: Encounter for post-procedure evaluation of chronic illness with mild to moderate exacerbation CC: Tailbone Pain (bilateral ) and Back Pain (base of spine)  HPI  Larry Shaffer is a 76 y.o. year old, male patient, who comes today for a post-procedure evaluation. He has Lumbar radiculopathy; Spondylosis without myelopathy or radiculopathy, lumbar region; Spinal stenosis of lumbar region without neurogenic claudication; and Acute right-sided low back pain with right-sided sciatica on their problem list. His primarily concern today is the Tailbone Pain (bilateral ) and Back Pain (base of spine)  Pain Assessment: Location:   Buttocks(both cheeks) Radiating: down to hamstrings Onset: More than a month ago Duration: Chronic pain Quality: Cramping, Spasm(knots) Severity: 6 /10 (self-reported pain score)  Note: Reported level is inconsistent with clinical observations. Clinically the patient looks like a 3/10 A 3/10 is viewed as "Moderate" and described as significantly interfering with activities of daily living (ADL). It becomes difficult to feed, bathe, get dressed, get on and off the toilet or to perform personal hygiene functions. Difficult to get in and out of bed or a chair without assistance. Very distracting. With effort, it can be ignored when deeply involved in activities.       When using our objective Pain Scale, levels between 6 and 10/10 are said to belong in an emergency room, as it progressively worsens from a 6/10, described as severely limiting, requiring emergency care not usually available at an outpatient pain management  facility. At a 6/10 level, communication becomes difficult and requires great effort. Assistance to reach the emergency department may be required. Facial flushing and profuse sweating along with potentially dangerous increases in heart rate and blood pressure will be evident. Effect on ADL: difficult to walk distance Timing: Constant Modifying factors: epidural, sitting  Larry Shaffer comes in today for post-procedure evaluation after the treatment done on 02/04/2017.  Further details on both, my assessment(s), as well as the proposed treatment plan, please see below.  Post-Procedure Assessment  02/04/2017 Procedure: Right L5-S1 epidural steroid injection Pre-procedure pain score:  8/10 Post-procedure pain score: 8/10         Influential Factors: BMI: 34.97 kg/m Intra-procedural challenges: None observed.         Assessment challenges: None detected.              Reported side-effects: None.        Post-procedural adverse reactions or complications: None reported         Sedation: Please see nurses note. When no sedatives are used, the analgesic levels obtained are directly associated to the effectiveness of the local anesthetics. However, when sedation is provided, the level of analgesia obtained during the initial 1 hour following the intervention, is believed to be the result of a combination of factors. These factors may include, but are not limited to: 1. The effectiveness of the local anesthetics used. 2. The effects of the analgesic(s) and/or anxiolytic(s) used. 3. The degree of discomfort experienced by the patient at the time of the procedure. 4. The patients ability and reliability in recalling and recording the events. 5. The presence and influence of possible secondary gains and/or psychosocial factors. Reported result:  Relief experienced during the 1st hour after the procedure: 100 % (Ultra-Short Term Relief)            Interpretative annotation: Clinically appropriate result.  Analgesia during this period is likely to be Local Anesthetic and/or IV Sedative (Analgesic/Anxiolytic) related.          Effects of local anesthetic: The analgesic effects attained during this period are directly associated to the localized infiltration of local anesthetics and therefore cary significant diagnostic value as to the etiological location, or anatomical origin, of the pain. Expected duration of relief is directly dependent on the pharmacodynamics of the local anesthetic used. Long-acting (4-6 hours) anesthetics used.  Reported result: Relief during the next 4 to 6 hour after the procedure: 100 % (Short-Term Relief)            Interpretative annotation: Clinically appropriate result. Analgesia during this period is likely to be Local Anesthetic-related.          Long-term benefit: Defined as the period of time past the expected duration of local anesthetics (1 hour for short-acting and 4-6 hours for long-acting). With the possible exception of prolonged sympathetic blockade from the local anesthetics, benefits during this period are typically attributed to, or associated with, other factors such as analgesic sensory neuropraxia, antiinflammatory effects, or beneficial biochemical changes provided by agents other than the local anesthetics.  Reported result: Extended relief following procedure: 50 %(4-5 days no pain) (Long-Term Relief)            Interpretative annotation: Clinically appropriate result. Good relief. No permanent benefit expected. Inflammation plays a part in the etiology to the pain.          Current benefits: Defined as reported results that persistent at this point in time.   Analgesia: 0-25 %            Function: Somewhat improved ROM: Somewhat improved Interpretative annotation: Recurrence of symptoms. No permanent benefit expected. Effective diagnostic intervention.          Interpretation: Results would suggest a successful diagnostic intervention.                   Plan:  Please see "Plan of Care" for details.        Patient states significant improvement and axial and lower extremity symptoms for approximately 3-4 days.  Had easier time ambulating and performing ADLs.  After 3 days return of back and leg pain to baseline levels and return of muscle spasms.  Patient states that his low back pain has shifted a little bit more towards the left and extends down to his buttocks and posterior thighs with intermittent sharp shooting pain into his toes provoked by certain movements.  Laboratory Chemistry  Inflammation Markers (CRP: Acute Phase) (ESR: Chronic Phase) No results found for: CRP, ESRSEDRATE               Renal Function Markers Lab Results  Component Value Date   CREATININE 1.60 (H) 01/26/2017                 Hepatic Function Markers No results found for: AST, ALT, ALBUMIN, ALKPHOS, HCVAB               Electrolytes No results found for: NA, K, CL, CALCIUM, MG               Neuropathy Markers No results found for: VITAMINB12               Bone  Pathology Markers No results found for: Hendricks Milo, [id number], [id number], [id number], 25OHVITD1, 25OHVITD2, 25OHVITD3, CALCIUM, TESTOFREE, TESTOSTERONE               Rheumatology Markers No results found for: LABURIC              Coagulation Parameters No results found for: INR, LABPROT, APTT, PLT, DDIMER               Cardiovascular Markers No results found for: BNP, CKTOTAL, CKMB, TROPONINI, HGB, HCT               CA Markers No results found for: CEA, CA125, LABCA2               Note: Lab results reviewed.  Recent Diagnostic Imaging Results  DG C-Arm 1-60 Min-No Report Fluoroscopy was utilized by the requesting physician.  No radiographic  interpretation.   Complexity Note: Imaging results reviewed. Results shared with Larry Shaffer, using Layman's terms.                         Meds   Current Outpatient Medications:  .  atorvastatin (LIPITOR) 20 MG tablet, Take 1 tablet by  mouth daily., Disp: , Rfl:  .  famciclovir (FAMVIR) 500 MG tablet, Take 1 tablet by mouth 2 (two) times daily as needed., Disp: , Rfl:  .  glipiZIDE (GLUCOTROL XL) 10 MG 24 hr tablet, Take 10 mg by mouth daily., Disp: , Rfl:  .  pantoprazole (PROTONIX) 40 MG tablet, Take 1 tablet by mouth daily., Disp: , Rfl:  .  sucralfate (CARAFATE) 1 g tablet, Take 1 g by mouth 4 (four) times daily., Disp: , Rfl:  .  tamsulosin (FLOMAX) 0.4 MG CAPS capsule, Take 0.4 mg by mouth daily., Disp: , Rfl:  .  baclofen (LIORESAL) 10 MG tablet, Take 1 tablet (10 mg total) 3 (three) times daily by mouth., Disp: 90 tablet, Rfl: 0  ROS  Constitutional: Denies any fever or chills Gastrointestinal: No reported hemesis, hematochezia, vomiting, or acute GI distress Musculoskeletal: Denies any acute onset joint swelling, redness, loss of ROM, or weakness Neurological: No reported episodes of acute onset apraxia, aphasia, dysarthria, agnosia, amnesia, paralysis, loss of coordination, or loss of consciousness  Allergies  Larry Shaffer has No Known Allergies.  [place]  Drug: Larry Shaffer  reports that he does not use drugs. Alcohol:  reports that he drinks alcohol. Tobacco:  reports that  has never smoked. he has never used smokeless tobacco. Medical:  has a past medical history of Gastric ulcer and Hyperlipidemia. Surgical: Larry Shaffer  has a past surgical history that includes Knee arthroscopy (Bilateral, 2003, 2004) and tonsillectomty (1947). Family: family history includes Cancer in his sister; Diabetes in his mother; Heart disease in his father; Stroke in his mother.  Constitutional Exam  General appearance: Well nourished, well developed, and well hydrated. In no apparent acute distress Vitals:   03/11/17 1341  BP: (!) 152/69  Pulse: 65  Temp: (!) 97.5 F (36.4 C)  TempSrc: Oral  SpO2: 98%  Weight: 230 lb (104.3 kg)  Height: '5\' 8"'$  (1.727 m)   BMI Assessment: Estimated body mass index is 34.97 kg/m as calculated  from the following:   Height as of this encounter: '5\' 8"'$  (1.727 m).   Weight as of this encounter: 230 lb (104.3 kg).  BMI interpretation table: BMI level Category Range association with higher incidence of chronic pain  <18 kg/m2 Underweight  18.5-24.9 kg/m2 Ideal body weight   25-29.9 kg/m2 Overweight Increased incidence by 20%  30-34.9 kg/m2 Obese (Class I) Increased incidence by 68%  35-39.9 kg/m2 Severe obesity (Class II) Increased incidence by 136%  >40 kg/m2 Extreme obesity (Class III) Increased incidence by 254%   BMI Readings from Last 4 Encounters:  03/11/17 34.97 kg/m  02/04/17 34.97 kg/m   Wt Readings from Last 4 Encounters:  03/11/17 230 lb (104.3 kg)  02/04/17 230 lb (104.3 kg)  Psych/Mental status: Alert, oriented x 3 (person, place, & time)       Eyes: PERLA Respiratory: No evidence of acute respiratory distress  Cervical Spine Area Exam  Skin & Axial Inspection: No masses, redness, edema, swelling, or associated skin lesions Alignment: Symmetrical Functional ROM: Unrestricted ROM      Stability: No instability detected Muscle Tone/Strength: Functionally intact. No obvious neuro-muscular anomalies detected. Sensory (Neurological): Unimpaired Palpation: No palpable anomalies              Upper Extremity (UE) Exam    Side: Right upper extremity  Side: Left upper extremity  Skin & Extremity Inspection: Skin color, temperature, and hair growth are WNL. No peripheral edema or cyanosis. No masses, redness, swelling, asymmetry, or associated skin lesions. No contractures.  Skin & Extremity Inspection: Skin color, temperature, and hair growth are WNL. No peripheral edema or cyanosis. No masses, redness, swelling, asymmetry, or associated skin lesions. No contractures.  Functional ROM: Unrestricted ROM          Functional ROM: Unrestricted ROM          Muscle Tone/Strength: Functionally intact. No obvious neuro-muscular anomalies detected.  Muscle Tone/Strength:  Functionally intact. No obvious neuro-muscular anomalies detected.  Sensory (Neurological): Unimpaired          Sensory (Neurological): Unimpaired          Palpation: No palpable anomalies              Palpation: No palpable anomalies              Specialized Test(s): Deferred         Specialized Test(s): Deferred          Thoracic Spine Area Exam  Skin & Axial Inspection: No masses, redness, or swelling Alignment: Symmetrical Functional ROM: Unrestricted ROM Stability: No instability detected Muscle Tone/Strength: Functionally intact. No obvious neuro-muscular anomalies detected. Sensory (Neurological): Unimpaired Muscle strength & Tone: No palpable anomalies  Lumbar Spine Area Exam  Skin & Axial Inspection: No masses, redness, or swelling Alignment: Symmetrical Functional ROM: Unrestricted ROM      Stability: No instability detected Muscle Tone/Strength: Functionally intact. No obvious neuro-muscular anomalies detected. Sensory (Neurological): Unimpaired Palpation: No palpable anomalies       Provocative Tests: Lumbar Hyperextension and rotation test: Positive bilaterally for facet joint pain. Lumbar Lateral bending test: Positive ipsilateral radicular pain, bilaterally. Positive for bilateral foraminal stenosis. Patrick's Maneuver: evaluation deferred today                    Gait & Posture Assessment  Ambulation: Unassisted Gait: Relatively normal for age and body habitus Posture: WNL   Lower Extremity Exam    Side: Right lower extremity  Side: Left lower extremity  Skin & Extremity Inspection: Skin color, temperature, and hair growth are WNL. No peripheral edema or cyanosis. No masses, redness, swelling, asymmetry, or associated skin lesions. No contractures.  Skin & Extremity Inspection: Skin color, temperature, and hair growth are WNL. No peripheral edema or  cyanosis. No masses, redness, swelling, asymmetry, or associated skin lesions. No contractures.  Functional ROM:  Unrestricted ROM          Functional ROM: Unrestricted ROM          Muscle Tone/Strength: Functionally intact. No obvious neuro-muscular anomalies detected.  Muscle Tone/Strength: Functionally intact. No obvious neuro-muscular anomalies detected.  Sensory (Neurological): Unimpaired  Sensory (Neurological): Unimpaired  Palpation: No palpable anomalies  Palpation: No palpable anomalies   Assessment  Primary Diagnosis & Pertinent Problem List: The primary encounter diagnosis was Lumbar radiculopathy. Diagnoses of Spondylosis without myelopathy or radiculopathy, lumbar region and Spinal stenosis of lumbar region without neurogenic claudication were also pertinent to this visit.  Status Diagnosis  Persistent Stable Stable 1. Lumbar radiculopathy   2. Spondylosis without myelopathy or radiculopathy, lumbar region   3. Spinal stenosis of lumbar region without neurogenic claudication      76 year old gentleman with history of axial low back pain that radiates into his right leg (right buttock and right posterior thigh stopping at his knee).  Patient's lumbar MRI shows extraforaminal disc protrusion on the right at L3-L4 compressing the right L3 nerve root along with facet and ligamentous hypertrophy. Patient also has moderate multifactorial spinal stenosis at L4-L5 with moderate narrowing of the lateral recess and foramina bilaterally.  Patient status post right L5-S1 epidural steroid injection on February 04, 2017 with significant improvement in axial and radicular symptoms for approximately 3-4 days with return of pain and muscle spasms thereafter.  We discussed the risks and benefits of repeating epidural steroid injection #2, patient would like to proceed.  I will also prescribe the patient baclofen for his muscle spasms to take as needed.    Plan: -Lumbar epidural steroid injection #2 directed towards the midline.  -At baclofen 10 mg 3 times daily as needed muscle spasms.  Discontinue tizanidine  given abnormal dreams.   Plan of Care  Pharmacotherapy (Medications Ordered): Meds ordered this encounter  Medications  . baclofen (LIORESAL) 10 MG tablet    Sig: Take 1 tablet (10 mg total) 3 (three) times daily by mouth.    Dispense:  90 tablet    Refill:  0    Do not place this medication, or any other prescription from our practice, on "Automatic Refill". Patient may have prescription filled one day early if pharmacy is closed on scheduled refill date.   Lab-work, procedure(s), and/or referral(s): Orders Placed This Encounter  Procedures  . Lumbar Epidural Injection    Provider-requested follow-up: Return for Procedure.  Future Appointments  Date Time Provider [place]  03/25/2017  9:45 AM Gillis Santa, MD [facility] None    Primary Care Physician: Tracie Harrier, MD Location: [facility] Note by: Gillis Santa, M.D Date: 03/11/2017; Time: 3:36 PM  Patient Instructions  1. Schedule for repeat lumbar epidural epidural steroid 2. Baclofen 10 mg up to three times a day as needed for muscle spasms   Preparing for your procedure (without sedation) Instructions: . Oral Intake: Do not eat or drink anything for at least 3 hours prior to your procedure. . Transportation: Unless otherwise stated by your physician, you may drive yourself after the procedure. . Blood Pressure Medicine: Take your blood pressure medicine with a sip of water the morning of the procedure. . Insulin: Take only  of your normal insulin dose. . Preventing infections: Shower with an antibacterial soap the morning of your procedure. . Build-up your immune system: Take 1000 mg of Vitamin C with every  meal (3 times a day) the day prior to your procedure. . Pregnancy: If you are pregnant, call and cancel the procedure. . Sickness: If you have a cold, fever, or any active infections, call and cancel the procedure. . Arrival: You must be in the facility at least 30  minutes prior to your scheduled procedure. . Children: Do not bring any children with you. . Dress appropriately: Bring dark clothing that you would not mind if they get stained. . Valuables: Do not bring any jewelry or valuables. Procedure appointments are reserved for interventional treatments only. Marland Kitchen No Prescription Refills. . No medication changes will be discussed during procedure appointments. . No disability issues will be discussed.

## 2017-03-11 NOTE — Progress Notes (Signed)
Safety precautions to be maintained throughout the outpatient stay will include: orient to surroundings, keep bed in low position, maintain call bell within reach at all times, provide assistance with transfer out of bed and ambulation.  

## 2017-03-14 ENCOUNTER — Ambulatory Visit: Payer: Medicare Other | Admitting: Student in an Organized Health Care Education/Training Program

## 2017-03-25 ENCOUNTER — Encounter: Payer: Self-pay | Admitting: Student in an Organized Health Care Education/Training Program

## 2017-03-25 ENCOUNTER — Ambulatory Visit (HOSPITAL_BASED_OUTPATIENT_CLINIC_OR_DEPARTMENT_OTHER): Payer: Medicare Other | Admitting: Student in an Organized Health Care Education/Training Program

## 2017-03-25 ENCOUNTER — Other Ambulatory Visit: Payer: Self-pay

## 2017-03-25 ENCOUNTER — Ambulatory Visit
Admission: RE | Admit: 2017-03-25 | Discharge: 2017-03-25 | Disposition: A | Discharge status: Home / Self Care | Payer: Medicare Other | Source: Ambulatory Visit | Attending: Student in an Organized Health Care Education/Training Program | Admitting: Student in an Organized Health Care Education/Training Program

## 2017-03-25 VITALS — BP 145/90 | HR 103 | Temp 98.3°F | Resp 15 | Ht 68.0 in | Wt 236.0 lb

## 2017-03-25 DIAGNOSIS — M5416 Radiculopathy, lumbar region: Secondary | ICD-10-CM

## 2017-03-25 DIAGNOSIS — M48061 Spinal stenosis, lumbar region without neurogenic claudication: Secondary | ICD-10-CM

## 2017-03-25 DIAGNOSIS — M545 Low back pain: Secondary | ICD-10-CM | POA: Insufficient documentation

## 2017-03-25 DIAGNOSIS — M4726 Other spondylosis with radiculopathy, lumbar region: Secondary | ICD-10-CM | POA: Insufficient documentation

## 2017-03-25 DIAGNOSIS — M47816 Spondylosis without myelopathy or radiculopathy, lumbar region: Secondary | ICD-10-CM

## 2017-03-25 MED ORDER — SODIUM CHLORIDE 0.9% FLUSH
2.0000 mL | Freq: Once | INTRAVENOUS | Status: AC
  Administered 2017-03-25: 10 mL

## 2017-03-25 MED ORDER — IOPAMIDOL (ISOVUE-M 200) INJECTION 41%
10.0000 mL | Freq: Once | INTRAMUSCULAR | Status: AC
  Administered 2017-03-25: 10 mL via EPIDURAL
  Filled 2017-03-25: qty 10

## 2017-03-25 MED ORDER — LIDOCAINE HCL (PF) 1 % IJ SOLN
10.0000 mL | Freq: Once | INTRAMUSCULAR | Status: AC
  Administered 2017-03-25: 5 mL
  Filled 2017-03-25: qty 10

## 2017-03-25 MED ORDER — ROPIVACAINE HCL 2 MG/ML IJ SOLN
2.0000 mL | Freq: Once | INTRAMUSCULAR | Status: AC
  Administered 2017-03-25: 10 mL via EPIDURAL
  Filled 2017-03-25: qty 10

## 2017-03-25 MED ORDER — DEXAMETHASONE SODIUM PHOSPHATE 10 MG/ML IJ SOLN
10.0000 mg | Freq: Once | INTRAMUSCULAR | Status: AC
  Administered 2017-03-25: 10 mg
  Filled 2017-03-25: qty 1

## 2017-03-25 NOTE — Patient Instructions (Signed)

## 2017-03-25 NOTE — Progress Notes (Signed)
Patient's Name: Larry Shaffer  MRN: [id number]  Referring Provider: Tracie Harrier, MD  DOB: 12-08-1940  PCP: Tracie Harrier, MD  DOS: 03/25/2017  Note by: Gillis Santa, MD  Service setting: Ambulatory outpatient  Specialty: Interventional Pain Management  Patient type: Established  Location: [facility] (AMB) Pain Management Facility  Visit type: Interventional Procedure   Primary Reason for Visit: Interventional Pain Management Treatment. CC: Back Pain (low)  Procedure:  Anesthesia, Analgesia, Anxiolysis:  Type: Therapeutic Inter-Laminar Epidural Steroid Injection #2 Region: Lumbar Level: L5-S1 Level. Laterality: Right-Sided         Type: Local Anesthesia Local Anesthetic: Lidocaine 1% Route: Infiltration ([place]/IM) IV Access: Declined Sedation: Meaningful verbal contact was maintained at all times during the procedure  Indication(s): Analgesia and Anxiety   Indications: 1. Lumbar radiculopathy   2. Spondylosis without myelopathy or radiculopathy, lumbar region   3. Spinal stenosis of lumbar region without neurogenic claudication    Pain Score: Pre-procedure: 5 /10 Post-procedure: 0-No pain/10  Pre-op Assessment:  [name] is a 76 y.o. (year old), male patient, seen today for interventional treatment. He  has a past surgical history that includes Knee arthroscopy (Bilateral, 2003, 2004) and tonsillectomty (1947). [name] has a current medication list which includes the following prescription(s): atorvastatin, baclofen, famciclovir, glipizide, pantoprazole, sucralfate, and tamsulosin. His primarily concern today is the Back Pain (low)  Initial Vital Signs: Blood pressure (!) 144/77, pulse 67, temperature 97.8 F (36.6 C), temperature source Oral, resp. rate 16, height 5\' 8"  (1.727 m), weight 230 lb (104.3 kg), SpO2 97 %. BMI: Estimated body mass index is 35.88 kg/m as calculated from the following:   Height as of this encounter: 5\' 8"  (1.727 m).   Weight as of this  encounter: 236 lb (107 kg).  Risk Assessment: Allergies: Reviewed. He has No Known Allergies.  Allergy Precautions: None required Coagulopathies: Reviewed. None identified.  Blood-thinner therapy: None at this time Active Infection(s): Reviewed. None identified. [name] is afebrile  Site Confirmation: [name] was asked to confirm the procedure and laterality before marking the site Procedure checklist: Completed Consent: Before the procedure and under the influence of no sedative(s), amnesic(s), or anxiolytics, the patient was informed of the treatment options, risks and possible complications. To fulfill our ethical and legal obligations, as recommended by the American Medical Association's Code of Ethics, I have informed the patient of my clinical impression; the nature and purpose of the treatment or procedure; the risks, benefits, and possible complications of the intervention; the alternatives, including doing nothing; the risk(s) and benefit(s) of the alternative treatment(s) or procedure(s); and the risk(s) and benefit(s) of doing nothing. The patient was provided information about the general risks and possible complications associated with the procedure. These may include, but are not limited to: failure to achieve desired goals, infection, bleeding, organ or nerve damage, allergic reactions, paralysis, and death. In addition, the patient was informed of those risks and complications associated to Spine-related procedures, such as failure to decrease pain; infection (i.e.: Meningitis, epidural or intraspinal abscess); bleeding (i.e.: epidural hematoma, subarachnoid hemorrhage, or any other type of intraspinal or peri-dural bleeding); organ or nerve damage (i.e.: Any type of peripheral nerve, nerve root, or spinal cord injury) with subsequent damage to sensory, motor, and/or autonomic systems, resulting in permanent pain, numbness, and/or weakness of one or several areas of the body;  allergic reactions; (i.e.: anaphylactic reaction); and/or death. Furthermore, the patient was informed of those risks and complications associated with the medications. These include, but are  not limited to: allergic reactions (i.e.: anaphylactic or anaphylactoid reaction(s)); adrenal axis suppression; blood sugar elevation that in diabetics may result in ketoacidosis or comma; water retention that in patients with history of congestive heart failure may result in shortness of breath, pulmonary edema, and decompensation with resultant heart failure; weight gain; swelling or edema; medication-induced neural toxicity; particulate matter embolism and blood vessel occlusion with resultant organ, and/or nervous system infarction; and/or aseptic necrosis of one or more joints. Finally, the patient was informed that Medicine is not an exact science; therefore, there is also the possibility of unforeseen or unpredictable risks and/or possible complications that may result in a catastrophic outcome. The patient indicated having understood very clearly. We have given the patient no guarantees and we have made no promises. Enough time was given to the patient to ask questions, all of which were answered to the patient's satisfaction. [name] has indicated that he wanted to continue with the procedure. Attestation: I, the ordering provider, attest that I have discussed with the patient the benefits, risks, side-effects, alternatives, likelihood of achieving goals, and potential problems during recovery for the procedure that I have provided informed consent. Date: 03/25/2017; Time: 12:43 PM  Pre-Procedure Preparation:  Monitoring: As per clinic protocol. Respiration, ETCO2, SpO2, BP, heart rate and rhythm monitor placed and checked for adequate function Safety Precautions: Patient was assessed for positional comfort and pressure points before starting the procedure. Time-out: I initiated and conducted the  "Time-out" before starting the procedure, as per protocol. The patient was asked to participate by confirming the accuracy of the "Time Out" information. Verification of the correct person, site, and procedure were performed and confirmed by me, the nursing staff, and the patient. "Time-out" conducted as per Joint Commission's Universal Protocol (UP.01.01.01). "Time-out" Date & Time: 03/25/2017; 1039 hrs.  Description of Procedure Process:   Position: Prone with head of the table was raised to facilitate breathing. Target Area: The interlaminar space, initially targeting the lower laminar border of the superior vertebral body. Approach: Paramedial approach. Area Prepped: Entire Posterior Lumbar Region Prepping solution: ChloraPrep (2% chlorhexidine gluconate and 70% isopropyl alcohol) Safety Precautions: Aspiration looking for blood return was conducted prior to all injections. At no point did we inject any substances, as a needle was being advanced. No attempts were made at seeking any paresthesias. Safe injection practices and needle disposal techniques used. Medications properly checked for expiration dates. SDV (single dose vial) medications used. Description of the Procedure: Protocol guidelines were followed. The procedure needle was introduced through the skin, ipsilateral to the reported pain, and advanced to the target area. Bone was contacted and the needle walked caudad, until the lamina was cleared. The epidural space was identified using "loss-of-resistance technique" with 2-3 ml of PF-NaCl (0.9% NSS), in a 5cc LOR glass syringe. Vitals:   03/25/17 1044 03/25/17 1048 03/25/17 1053 03/25/17 1100  BP: (!) 168/100 (!) 174/123 (!) 199/108 (!) 145/90  Pulse: 100 (!) 101 (!) 103 (!) 103  Resp: (!) 22 19 15 15   Temp:      TempSrc:      SpO2: 96% 96% 97% 97%  Weight:      Height:        Start Time: 1039 hrs. End Time: 1057 hrs. Materials:  Needle(s) Type: Epidural needle Gauge:  17G Length: 3.5-in Medication(s): We administered iopamidol, ropivacaine (PF) 2 mg/mL (0.2%), sodium chloride flush, lidocaine (PF), and dexamethasone. Please see chart orders for dosing details. 5 cc of preservative-free normal saline, 2 cc  of 0.2% ropivacaine, 1 cc of Decadron 10 mg/cc: 8 cc total Imaging Guidance (Spinal):  Type of Imaging Technique: Fluoroscopy Guidance (Spinal) Indication(s): Assistance in needle guidance and placement for procedures requiring needle placement in or near specific anatomical locations not easily accessible without such assistance. Exposure Time: Please see nurses notes. Contrast: Before injecting any contrast, we confirmed that the patient did not have an allergy to iodine, shellfish, or radiological contrast. Once satisfactory needle placement was completed at the desired level, radiological contrast was injected. Contrast injected under live fluoroscopy. No contrast complications. See chart for type and volume of contrast used. Fluoroscopic Guidance: I was personally present during the use of fluoroscopy. "Tunnel Vision Technique" used to obtain the best possible view of the target area. Parallax error corrected before commencing the procedure. "Direction-depth-direction" technique used to introduce the needle under continuous pulsed fluoroscopy. Once target was reached, antero-posterior, oblique, and lateral fluoroscopic projection used confirm needle placement in all planes. Images permanently stored in EMR. Interpretation: I personally interpreted the imaging intraoperatively. Adequate needle placement confirmed in multiple planes. Appropriate spread of contrast into desired area was observed. No evidence of afferent or efferent intravascular uptake. No intrathecal or subarachnoid spread observed. Permanent images saved into the patient's record.  Antibiotic Prophylaxis:  Indication(s): None identified Antibiotic given: None  Post-operative Assessment:   EBL: None Complications: No immediate post-treatment complications observed by team, or reported by patient. Note: The patient tolerated the entire procedure well. A repeat set of vitals were taken after the procedure and the patient was kept under observation following institutional policy, for this type of procedure. Post-procedural neurological assessment was performed, showing return to baseline, prior to discharge. The patient was provided with post-procedure discharge instructions, including a section on how to identify potential problems. Should any problems arise concerning this procedure, the patient was given instructions to immediately contact us, at any time, without hesitation. In any case, we plan to contact the patient by telephone for a follow-up status report regarding this interventional procedure. Comments:  No additional relevant information. 5 out of 5 strength bilateral lower extremity: Plantar flexion, dorsiflexion, knee flexion, knee extension. Plan of Care    Imaging Orders     DG C-Arm 1-60 Min-No Report Procedure Orders    No procedure(s) ordered today    Medications ordered for procedure: Meds ordered this encounter  Medications  . iopamidol (ISOVUE-M) 41 % intrathecal injection 10 mL  . ropivacaine (PF) 2 mg/mL (0.2%) (NAROPIN) injection 2 mL  . sodium chloride flush (NS) 0.9 % injection 2 mL  . lidocaine (PF) (XYLOCAINE) 1 % injection 10 mL  . dexamethasone (DECADRON) injection 10 mg   Medications administered: We administered iopamidol, ropivacaine (PF) 2 mg/mL (0.2%), sodium chloride flush, lidocaine (PF), and dexamethasone.  See the medical record for exact dosing, route, and time of administration.  This SmartLink is deprecated. Use AVSMEDLIST instead to display the medication list for a patient. Disposition: Discharge home  Discharge Date & Time: 03/25/2017; 1105 hrs.   Physician-requested Follow-up: Return in about 2 weeks (around 04/08/2017) for  Post Procedure Evaluation. Future Appointments  Date Time Provider [place]  04/09/2017 10:30 AM Gillis Santa, MD [facility] None   Primary Care Physician: Tracie Harrier, MD Location: [facility] Note by: Gillis Santa, MD Date: 03/25/2017; Time: 2:13 PM  Disclaimer:  Medicine is not an exact science. The only guarantee in medicine is that nothing is guaranteed. It is important to note that the decision to proceed  with this intervention was based on the information collected from the patient. The Data and conclusions were drawn from the patient's questionnaire, the interview, and the physical examination. Because the information was provided in large part by the patient, it cannot be guaranteed that it has not been purposely or unconsciously manipulated. Every effort has been made to obtain as much relevant data as possible for this evaluation. It is important to note that the conclusions that lead to this procedure are derived in large part from the available data. Always take into account that the treatment will also be dependent on availability of resources and existing treatment guidelines, considered by other Pain Management Practitioners as being common knowledge and practice, at the time of the intervention. For Medico-Legal purposes, it is also important to point out that variation in procedural techniques and pharmacological choices are the acceptable norm. The indications, contraindications, technique, and results of the above procedure should only be interpreted and judged by a Board-Certified Interventional Pain Specialist with extensive familiarity and expertise in the same exact procedure and technique.

## 2017-03-26 ENCOUNTER — Telehealth: Payer: Self-pay | Admitting: *Deleted

## 2017-03-26 NOTE — Telephone Encounter (Signed)
Voicemail left with patient to call our office if there are questions or concerns re; procedure on yesterday.  

## 2017-04-09 ENCOUNTER — Encounter: Payer: Self-pay | Admitting: Student in an Organized Health Care Education/Training Program

## 2017-04-09 ENCOUNTER — Ambulatory Visit
Payer: Medicare Other | Attending: Student in an Organized Health Care Education/Training Program | Admitting: Student in an Organized Health Care Education/Training Program

## 2017-04-09 ENCOUNTER — Other Ambulatory Visit: Payer: Self-pay

## 2017-04-09 VITALS — BP 129/65 | HR 70 | Temp 98.0°F | Resp 16 | Ht 68.0 in | Wt 230.0 lb

## 2017-04-09 DIAGNOSIS — Z79899 Other long term (current) drug therapy: Secondary | ICD-10-CM | POA: Diagnosis not present

## 2017-04-09 DIAGNOSIS — M545 Low back pain: Secondary | ICD-10-CM | POA: Diagnosis present

## 2017-04-09 DIAGNOSIS — M5441 Lumbago with sciatica, right side: Secondary | ICD-10-CM | POA: Diagnosis not present

## 2017-04-09 DIAGNOSIS — M48061 Spinal stenosis, lumbar region without neurogenic claudication: Secondary | ICD-10-CM | POA: Insufficient documentation

## 2017-04-09 DIAGNOSIS — M5416 Radiculopathy, lumbar region: Secondary | ICD-10-CM | POA: Diagnosis not present

## 2017-04-09 DIAGNOSIS — M47816 Spondylosis without myelopathy or radiculopathy, lumbar region: Secondary | ICD-10-CM

## 2017-04-09 DIAGNOSIS — Z7984 Long term (current) use of oral hypoglycemic drugs: Secondary | ICD-10-CM | POA: Insufficient documentation

## 2017-04-09 DIAGNOSIS — M4726 Other spondylosis with radiculopathy, lumbar region: Secondary | ICD-10-CM | POA: Diagnosis not present

## 2017-04-09 NOTE — Progress Notes (Signed)
Patient's Name: Larry Shaffer  MRN: [id number]  Referring Provider: Tracie Harrier, MD  DOB: [birth year]  PCP: Tracie Harrier, MD  DOS: 04/09/2017  Note by: Gillis Santa, MD  Service setting: Ambulatory outpatient  Specialty: Interventional Pain Management  Location: [facility] (AMB) Pain Management Facility    Patient type: Established   Primary Reason(s) for Visit: Encounter for post-procedure evaluation of chronic illness with mild to moderate exacerbation CC: Back Pain (low)  HPI  [name] is a 76 y.o. year old, male patient, who comes today for a post-procedure evaluation. He has Lumbar radiculopathy; Spondylosis without myelopathy or radiculopathy, lumbar region; Spinal stenosis of lumbar region without neurogenic claudication; and Acute right-sided low back pain with right-sided sciatica on their problem list. His primarily concern today is the Back Pain (low)  Pain Assessment: Location: Lower Back Radiating: radiates to right and left cheeks Onset: More than a month ago Duration: Chronic pain Quality: Other (Comment)(denies pain today) Severity: 0-No pain/10 (self-reported pain score)  Note: Reported level is compatible with observation.                         When using our objective Pain Scale, levels between 6 and 10/10 are said to belong in an emergency room, as it progressively worsens from a 6/10, described as severely limiting, requiring emergency care not usually available at an outpatient pain management facility. At a 6/10 level, communication becomes difficult and requires great effort. Assistance to reach the emergency department may be required. Facial flushing and profuse sweating along with potentially dangerous increases in heart rate and blood pressure will be evident. Effect on ADL:   Timing: (no pain for last 10 days) Modifying factors: procedures  [name] comes in today for post-procedure evaluation after the treatment done on 03/25/2017.  Further  details on both, my assessment(s), as well as the proposed treatment plan, please see below.  Post-Procedure Assessment  03/25/2017 Procedure: Right L5-S1 ESI #2 Pre-procedure pain score:  5/10 Post-procedure pain score: 0/10         Influential Factors: BMI: 34.97 kg/m Intra-procedural challenges: None observed.         Assessment challenges: None detected.              Reported side-effects: None.        Post-procedural adverse reactions or complications: None reported         Sedation: Please see nurses note. When no sedatives are used, the analgesic levels obtained are directly associated to the effectiveness of the local anesthetics. However, when sedation is provided, the level of analgesia obtained during the initial 1 hour following the intervention, is believed to be the result of a combination of factors. These factors may include, but are not limited to: 1. The effectiveness of the local anesthetics used. 2. The effects of the analgesic(s) and/or anxiolytic(s) used. 3. The degree of discomfort experienced by the patient at the time of the procedure. 4. The patients ability and reliability in recalling and recording the events. 5. The presence and influence of possible secondary gains and/or psychosocial factors. Reported result: Relief experienced during the 1st hour after the procedure: 100 % (Ultra-Short Term Relief)            Interpretative annotation: Clinically appropriate result. Analgesia during this period is likely to be Local Anesthetic and/or IV Sedative (Analgesic/Anxiolytic) related.          Effects of local anesthetic: The analgesic effects attained during  this period are directly associated to the localized infiltration of local anesthetics and therefore cary significant diagnostic value as to the etiological location, or anatomical origin, of the pain. Expected duration of relief is directly dependent on the pharmacodynamics of the local anesthetic used.  Long-acting (4-6 hours) anesthetics used.  Reported result: Relief during the next 4 to 6 hour after the procedure: 100 % (Short-Term Relief)            Interpretative annotation: Clinically appropriate result. Analgesia during this period is likely to be Local Anesthetic-related.          Long-term benefit: Defined as the period of time past the expected duration of local anesthetics (1 hour for short-acting and 4-6 hours for long-acting). With the possible exception of prolonged sympathetic blockade from the local anesthetics, benefits during this period are typically attributed to, or associated with, other factors such as analgesic sensory neuropraxia, antiinflammatory effects, or beneficial biochemical changes provided by agents other than the local anesthetics.  Reported result: Extended relief following procedure: 100 % (Long-Term Relief)            Interpretative annotation: Clinically appropriate result. Good relief. Therapeutic success. Significant inflammatory component detected.          Current benefits: Defined as reported results that persistent at this point in time.   Analgesia: 75-100 % [name] reports that both, extremity and the axial pain improved with the treatment. Function: [name] reports improvement in function ROM: [name] reports improvement in ROM Interpretative annotation: Complete relief. Therapeutic success. Effective therapeutic approach.          Interpretation: Results would suggest a successful diagnostic and therapeutic intervention.                  Plan:  Please see "Plan of Care" for details.        Laboratory Chemistry  Inflammation Markers (CRP: Acute Phase) (ESR: Chronic Phase) No results found for: CRP, ESRSEDRATE, LATICACIDVEN               Rheumatology Markers No results found for: RF, ANA, LABURIC, URICUR, LYMEIGGIGMAB, LYMEABIGMQN              Renal Function Markers Lab Results  Component Value Date   CREATININE 1.60 (H)  01/26/2017                 Hepatic Function Markers No results found for: AST, ALT, ALBUMIN, ALKPHOS, HCVAB, AMYLASE, LIPASE, AMMONIA               Electrolytes No results found for: NA, K, CL, CALCIUM, MG, PHOS               Neuropathy Markers No results found for: VITAMINB12, FOLATE, HGBA1C, HIV               Bone Pathology Markers No results found for: VD25OH, [id number], [id number], GU5427CW2, 25OHVITD1, 25OHVITD2, 25OHVITD3, TESTOFREE, TESTOSTERONE               Coagulation Parameters No results found for: INR, LABPROT, APTT, PLT, DDIMER               Cardiovascular Markers No results found for: BNP, CKTOTAL, CKMB, TROPONINI, HGB, HCT               CA Markers No results found for: CEA, CA125, LABCA2               Note: Lab results reviewed.  Recent Diagnostic  Imaging Results  DG C-Arm 1-60 Min-No Report Fluoroscopy was utilized by the requesting physician.  No radiographic  interpretation.   Complexity Note: Imaging results reviewed. Results shared with Mr. Grape, using Layman's terms.                         Meds   Current Outpatient Medications:  .  atorvastatin (LIPITOR) 20 MG tablet, Take 1 tablet by mouth daily., Disp: , Rfl:  .  famciclovir (FAMVIR) 500 MG tablet, Take 1 tablet by mouth 2 (two) times daily as needed., Disp: , Rfl:  .  glipiZIDE (GLUCOTROL XL) 10 MG 24 hr tablet, Take 10 mg by mouth daily., Disp: , Rfl:  .  pantoprazole (PROTONIX) 40 MG tablet, Take 1 tablet by mouth daily., Disp: , Rfl:  .  sucralfate (CARAFATE) 1 g tablet, Take 1 g by mouth 4 (four) times daily., Disp: , Rfl:  .  tamsulosin (FLOMAX) 0.4 MG CAPS capsule, Take 0.4 mg by mouth daily., Disp: , Rfl:  .  baclofen (LIORESAL) 10 MG tablet, Take 1 tablet (10 mg total) 3 (three) times daily by mouth. (Patient not taking: Reported on 04/09/2017), Disp: 90 tablet, Rfl: 0  ROS  Constitutional: Denies any fever or chills Gastrointestinal: No reported hemesis, hematochezia, vomiting, or  acute GI distress Musculoskeletal: Denies any acute onset joint swelling, redness, loss of ROM, or weakness Neurological: No reported episodes of acute onset apraxia, aphasia, dysarthria, agnosia, amnesia, paralysis, loss of coordination, or loss of consciousness  Allergies  Mr. Lagrange has No Known Allergies.  [place]  Drug: Mr. Gavitt  reports that he does not use drugs. Alcohol:  reports that he drinks alcohol. Tobacco:  reports that  has never smoked. he has never used smokeless tobacco. Medical:  has a past medical history of Gastric ulcer and Hyperlipidemia. Surgical: Mr. Halterman  has a past surgical history that includes Knee arthroscopy (Bilateral, 2003, 2004) and tonsillectomty (1947). Family: family history includes Cancer in his sister; Diabetes in his mother; Heart disease in his father; Stroke in his mother.  Constitutional Exam  General appearance: Well nourished, well developed, and well hydrated. In no apparent acute distress Vitals:   04/09/17 1029  BP: 129/65  Pulse: 70  Resp: 16  Temp: 98 F (36.7 C)  TempSrc: Oral  SpO2: 97%  Weight: 230 lb (104.3 kg)  Height: _0  (1.727 m)   BMI Assessment: Estimated body mass index is 34.97 kg/m as calculated from the following:   Height as of this encounter: _1  (1.727 m).   Weight as of this encounter: 230 lb (104.3 kg).  BMI interpretation table: BMI level Category Range association with higher incidence of chronic pain  <18 kg/m2 Underweight   18.5-24.9 kg/m2 Ideal body weight   25-29.9 kg/m2 Overweight Increased incidence by 20%  30-34.9 kg/m2 Obese (Class I) Increased incidence by 68%  35-39.9 kg/m2 Severe obesity (Class II) Increased incidence by 136%  >40 kg/m2 Extreme obesity (Class III) Increased incidence by 254%   BMI Readings from Last 4 Encounters:  04/09/17 34.97 kg/m  03/25/17 35.88 kg/m  03/11/17 34.97 kg/m  02/04/17 34.97 kg/m   Wt Readings from Last 4 Encounters:  04/09/17 230 lb (104.3  kg)  03/25/17 236 lb (107 kg)  03/11/17 230 lb (104.3 kg)  02/04/17 230 lb (104.3 kg)  Psych/Mental status: Alert, oriented x 3 (person, place, & time)       Eyes: PERLA Respiratory: No evidence of  acute respiratory distress  Cervical Spine Area Exam  Skin & Axial Inspection: No masses, redness, edema, swelling, or associated skin lesions Alignment: Symmetrical Functional ROM: Unrestricted ROM      Stability: No instability detected Muscle Tone/Strength: Functionally intact. No obvious neuro-muscular anomalies detected. Sensory (Neurological): Unimpaired Palpation: No palpable anomalies              Upper Extremity (UE) Exam    Side: Right upper extremity  Side: Left upper extremity  Skin & Extremity Inspection: Skin color, temperature, and hair growth are WNL. No peripheral edema or cyanosis. No masses, redness, swelling, asymmetry, or associated skin lesions. No contractures.  Skin & Extremity Inspection: Skin color, temperature, and hair growth are WNL. No peripheral edema or cyanosis. No masses, redness, swelling, asymmetry, or associated skin lesions. No contractures.  Functional ROM: Unrestricted ROM          Functional ROM: Unrestricted ROM          Muscle Tone/Strength: Functionally intact. No obvious neuro-muscular anomalies detected.  Muscle Tone/Strength: Functionally intact. No obvious neuro-muscular anomalies detected.  Sensory (Neurological): Unimpaired          Sensory (Neurological): Unimpaired          Palpation: No palpable anomalies              Palpation: No palpable anomalies              Specialized Test(s): Deferred         Specialized Test(s): Deferred          Thoracic Spine Area Exam  Skin & Axial Inspection: No masses, redness, or swelling Alignment: Symmetrical Functional ROM: Unrestricted ROM Stability: No instability detected Muscle Tone/Strength: Functionally intact. No obvious neuro-muscular anomalies detected. Sensory (Neurological): Unimpaired Muscle  strength & Tone: No palpable anomalies  Lumbar Spine Area Exam  Skin & Axial Inspection: No masses, redness, or swelling Alignment: Symmetrical Functional ROM: Unrestricted ROM      Stability: No instability detected Muscle Tone/Strength: Functionally intact. No obvious neuro-muscular anomalies detected. Sensory (Neurological): Unimpaired Palpation: No palpable anomalies       Provocative Tests: Lumbar Hyperextension and rotation test: Improved after treatment      Right greater than left Lumbar Lateral bending test: Improved after treatment      Right greater than left Patrick's Maneuver: evaluation deferred today                    Gait & Posture Assessment  Ambulation: Unassisted Gait: Relatively normal for age and body habitus Posture: WNL   Lower Extremity Exam    Side: Right lower extremity  Side: Left lower extremity  Skin & Extremity Inspection: Skin color, temperature, and hair growth are WNL. No peripheral edema or cyanosis. No masses, redness, swelling, asymmetry, or associated skin lesions. No contractures.  Skin & Extremity Inspection: Skin color, temperature, and hair growth are WNL. No peripheral edema or cyanosis. No masses, redness, swelling, asymmetry, or associated skin lesions. No contractures.  Functional ROM: Unrestricted ROM          Functional ROM: Unrestricted ROM          Muscle Tone/Strength: Functionally intact. No obvious neuro-muscular anomalies detected.  Muscle Tone/Strength: Functionally intact. No obvious neuro-muscular anomalies detected.  Sensory (Neurological): Unimpaired  Sensory (Neurological): Unimpaired  Palpation: No palpable anomalies  Palpation: No palpable anomalies   Assessment  Primary Diagnosis & Pertinent Problem List: The primary encounter diagnosis was Lumbar radiculopathy. Diagnoses of Acute right-sided  low back pain with right-sided sciatica and Spondylosis without myelopathy or radiculopathy, lumbar region were also pertinent to this  visit.  Status Diagnosis  Improved Improved Stable 1. Lumbar radiculopathy   2. Acute right-sided low back pain with right-sided sciatica   3. Spondylosis without myelopathy or radiculopathy, lumbar region      76 year old male with history of axial low back pain with radiation into bilateral legs, right greater than left who presents for follow-up status post lumbar epidural steroid injection #2 at L5-S1 on 03/25/2017.Marland Kitchen  Patient's endorses significant pain relief of his axial and radicular symptoms.  He is able to ambulate and tolerate sitting for a longer period of time without as much pain.  He states that his quality of life is improved and he is able to perform activities of daily living with less pain.  I am very pleased with his results.  Patient can follow with me as needed if his symptoms return.  I have encouraged the patient to continue his physical therapy exercises at home.  Plan: -Significant improvement in radicular symptoms after lumbar ESI #2. -Continue PT stretches and exercises at home -Follow-up as needed       Primary Care Physician: Tracie Harrier, MD Location: [facility] Note by: Gillis Santa, M.D Date: 04/09/2017; Time: 11:01 AM  There are no Patient Instructions on file for this visit.

## 2017-04-09 NOTE — Progress Notes (Signed)
Safety precautions to be maintained throughout the outpatient stay will include: orient to surroundings, keep bed in low position, maintain call bell within reach at all times, provide assistance with transfer out of bed and ambulation.  

## 2017-11-29 ENCOUNTER — Encounter: Payer: Self-pay | Admitting: *Deleted

## 2017-12-02 ENCOUNTER — Ambulatory Visit
Admission: RE | Admit: 2017-12-02 | Discharge: 2017-12-02 | Disposition: A | Discharge status: Home / Self Care | Payer: Medicare Other | Source: Ambulatory Visit | Attending: Unknown Physician Specialty | Admitting: Unknown Physician Specialty

## 2017-12-02 ENCOUNTER — Ambulatory Visit: Payer: Medicare Other | Admitting: Anesthesiology

## 2017-12-02 ENCOUNTER — Encounter: Payer: Self-pay | Admitting: *Deleted

## 2017-12-02 ENCOUNTER — Encounter
Admission: RE | Disposition: A | Discharge status: Home / Self Care | Payer: Self-pay | Source: Ambulatory Visit | Attending: Unknown Physician Specialty

## 2017-12-02 DIAGNOSIS — Z6833 Body mass index (BMI) 33.0-33.9, adult: Secondary | ICD-10-CM | POA: Insufficient documentation

## 2017-12-02 DIAGNOSIS — Z79899 Other long term (current) drug therapy: Secondary | ICD-10-CM | POA: Diagnosis not present

## 2017-12-02 DIAGNOSIS — E669 Obesity, unspecified: Secondary | ICD-10-CM | POA: Insufficient documentation

## 2017-12-02 DIAGNOSIS — E119 Type 2 diabetes mellitus without complications: Secondary | ICD-10-CM | POA: Diagnosis not present

## 2017-12-02 DIAGNOSIS — Z8711 Personal history of peptic ulcer disease: Secondary | ICD-10-CM | POA: Diagnosis not present

## 2017-12-02 DIAGNOSIS — Z7984 Long term (current) use of oral hypoglycemic drugs: Secondary | ICD-10-CM | POA: Diagnosis not present

## 2017-12-02 DIAGNOSIS — Z1211 Encounter for screening for malignant neoplasm of colon: Secondary | ICD-10-CM | POA: Insufficient documentation

## 2017-12-02 DIAGNOSIS — D122 Benign neoplasm of ascending colon: Secondary | ICD-10-CM | POA: Diagnosis not present

## 2017-12-02 DIAGNOSIS — Z09 Encounter for follow-up examination after completed treatment for conditions other than malignant neoplasm: Secondary | ICD-10-CM | POA: Insufficient documentation

## 2017-12-02 DIAGNOSIS — K449 Diaphragmatic hernia without obstruction or gangrene: Secondary | ICD-10-CM | POA: Diagnosis not present

## 2017-12-02 DIAGNOSIS — K5730 Diverticulosis of large intestine without perforation or abscess without bleeding: Secondary | ICD-10-CM | POA: Diagnosis not present

## 2017-12-02 DIAGNOSIS — K640 First degree hemorrhoids: Secondary | ICD-10-CM | POA: Diagnosis not present

## 2017-12-02 DIAGNOSIS — E785 Hyperlipidemia, unspecified: Secondary | ICD-10-CM | POA: Insufficient documentation

## 2017-12-02 HISTORY — PX: ESOPHAGOGASTRODUODENOSCOPY (EGD) WITH PROPOFOL: SHX5813

## 2017-12-02 HISTORY — DX: Type 2 diabetes mellitus without complications: E11.9

## 2017-12-02 HISTORY — PX: COLONOSCOPY WITH PROPOFOL: SHX5780

## 2017-12-02 HISTORY — DX: Obesity, unspecified: E66.9

## 2017-12-02 LAB — GLUCOSE, CAPILLARY: Glucose-Capillary: 163 mg/dL — ABNORMAL HIGH (ref 70–99)

## 2017-12-02 SURGERY — ESOPHAGOGASTRODUODENOSCOPY (EGD) WITH PROPOFOL
Anesthesia: General

## 2017-12-02 MED ORDER — FENTANYL CITRATE (PF) 100 MCG/2ML IJ SOLN
INTRAMUSCULAR | Status: DC | PRN
  Administered 2017-12-02: 25 ug via INTRAVENOUS
  Administered 2017-12-02: 50 ug via INTRAVENOUS
  Administered 2017-12-02: 25 ug via INTRAVENOUS

## 2017-12-02 MED ORDER — MIDAZOLAM HCL 2 MG/2ML IJ SOLN
INTRAMUSCULAR | Status: AC
  Filled 2017-12-02: qty 2

## 2017-12-02 MED ORDER — SODIUM CHLORIDE 0.9 % IV SOLN: INTRAVENOUS | Status: DC

## 2017-12-02 MED ORDER — GLYCOPYRROLATE 0.2 MG/ML IJ SOLN
INTRAMUSCULAR | Status: AC
  Filled 2017-12-02: qty 1

## 2017-12-02 MED ORDER — LIDOCAINE HCL (PF) 2 % IJ SOLN
INTRAMUSCULAR | Status: AC
  Filled 2017-12-02: qty 10

## 2017-12-02 MED ORDER — GLYCOPYRROLATE 0.2 MG/ML IJ SOLN
INTRAMUSCULAR | Status: DC | PRN
  Administered 2017-12-02: 0.2 mg via INTRAVENOUS

## 2017-12-02 MED ORDER — PROPOFOL 10 MG/ML IV BOLUS
INTRAVENOUS | Status: DC | PRN
  Administered 2017-12-02: 30 mg via INTRAVENOUS
  Administered 2017-12-02 (×2): 20 mg via INTRAVENOUS

## 2017-12-02 MED ORDER — MIDAZOLAM HCL 5 MG/5ML IJ SOLN
INTRAMUSCULAR | Status: DC | PRN
  Administered 2017-12-02 (×2): 1 mg via INTRAVENOUS

## 2017-12-02 MED ORDER — FENTANYL CITRATE (PF) 100 MCG/2ML IJ SOLN
INTRAMUSCULAR | Status: AC
  Filled 2017-12-02: qty 2

## 2017-12-02 MED ORDER — PROPOFOL 500 MG/50ML IV EMUL
INTRAVENOUS | Status: DC | PRN
  Administered 2017-12-02: 50 ug/kg/min via INTRAVENOUS

## 2017-12-02 MED ORDER — SODIUM CHLORIDE 0.9 % IV SOLN
INTRAVENOUS | Status: DC
  Administered 2017-12-02: 1000 mL via INTRAVENOUS

## 2017-12-02 MED ORDER — LIDOCAINE HCL (PF) 2 % IJ SOLN
INTRAMUSCULAR | Status: DC | PRN
  Administered 2017-12-02: 100 mg

## 2017-12-02 NOTE — Anesthesia Post-op Follow-up Note (Signed)
Anesthesia QCDR form completed.        

## 2017-12-02 NOTE — Transfer of Care (Signed)
Immediate Anesthesia Transfer of Care Note  Patient: [name]  Procedure(s) Performed: ESOPHAGOGASTRODUODENOSCOPY (EGD) WITH PROPOFOL (N/A ) COLONOSCOPY WITH PROPOFOL (N/A )  Patient Location: PACU  Anesthesia Type:General  Level of Consciousness: sedated  Airway & Oxygen Therapy: Patient Spontanous Breathing and Patient connected to nasal cannula oxygen  Post-op Assessment: Report given to RN and Post -op Vital signs reviewed and stable  Post vital signs: Reviewed and stable  Last Vitals:  Vitals Value Taken Time  BP 128/72 12/02/2017 10:17 AM  Temp 36 C 12/02/2017 10:10 AM  Pulse 87 12/02/2017 10:18 AM  Resp 20 12/02/2017 10:18 AM  SpO2 95 % 12/02/2017 10:18 AM  Vitals shown include unvalidated device data.  Last Pain:  Vitals:   12/02/17 1010  TempSrc: Tympanic  PainSc:          Complications: No apparent anesthesia complications

## 2017-12-02 NOTE — Anesthesia Preprocedure Evaluation (Signed)
Anesthesia Evaluation  Patient identified by MRN, date of birth, ID band Patient awake    Reviewed: Allergy & Precautions, H&P , NPO status , Patient's Chart, lab work & pertinent test results, reviewed documented beta blocker date and time   Airway Mallampati: II   Neck ROM: full    Dental  (+) Poor Dentition, Teeth Intact   Pulmonary neg pulmonary ROS,    Pulmonary exam normal        Cardiovascular Exercise Tolerance: Good negative cardio ROS Normal cardiovascular exam Rhythm:regular Rate:Normal     Neuro/Psych  Neuromuscular disease negative neurological ROS  negative psych ROS   GI/Hepatic negative GI ROS, Neg liver ROS, PUD,   Endo/Other  negative endocrine ROSdiabetesMorbid obesity  Renal/GU negative Renal ROS  negative genitourinary   Musculoskeletal   Abdominal   Peds  Hematology negative hematology ROS (+)   Anesthesia Other Findings Past Medical History: No date: Diabetes mellitus without complication ([place]) No date: Gastric ulcer No date: Gastric ulcer No date: Hyperlipidemia No date: Hyperlipidemia No date: Obesity Past Surgical History: 2003, 2004: KNEE ARTHROSCOPY; Bilateral 1947: tonsillectomty No date: TONSILLECTOMY No date: TREATMENT FISTULA ANAL BMI    Body Mass Index:  33.97 kg/m     Reproductive/Obstetrics negative OB ROS                             Anesthesia Physical Anesthesia Plan  ASA: III  Anesthesia Plan: General   Post-op Pain Management:    Induction:   PONV Risk Score and Plan:   Airway Management Planned:   Additional Equipment:   Intra-op Plan:   Post-operative Plan:   Informed Consent: I have reviewed the patients History and Physical, chart, labs and discussed the procedure including the risks, benefits and alternatives for the proposed anesthesia with the patient or authorized representative who has indicated his/her  understanding and acceptance.   Dental Advisory Given  Plan Discussed with: CRNA  Anesthesia Plan Comments:         Anesthesia Quick Evaluation

## 2017-12-02 NOTE — H&P (Signed)
Primary Care Physician:  Tracie Harrier, MD Primary Gastroenterologist:  Dr. Vira Agar  Pre-Procedure History & Physical: HPI:  Larry Shaffer is a 77 y.o. male is here for an endoscopy and colonoscopy.  Done for screening exam of colon and history of gastric ulcer.   Past Medical History:  Diagnosis Date  . Diabetes mellitus without complication ([place])   . Gastric ulcer   . Gastric ulcer   . Hyperlipidemia   . Hyperlipidemia   . Obesity     Past Surgical History:  Procedure Laterality Date  . KNEE ARTHROSCOPY Bilateral 2003, 2004  . tonsillectomty  1947  . TONSILLECTOMY    . TREATMENT FISTULA ANAL      Prior to Admission medications   Medication Sig Start Date End Date Taking? Authorizing Provider  atorvastatin (LIPITOR) 20 MG tablet Take 1 tablet by mouth daily. 01/07/17  Yes [provider]  famciclovir (FAMVIR) 500 MG tablet Take 1 tablet by mouth 2 (two) times daily as needed. 01/07/17  Yes [provider]  glipiZIDE (GLUCOTROL XL) 10 MG 24 hr tablet Take 10 mg by mouth daily. 01/07/17 01/07/18 Yes [provider]  pantoprazole (PROTONIX) 40 MG tablet Take 1 tablet by mouth daily. 01/15/17  Yes [provider]  sucralfate (CARAFATE) 1 g tablet Take 1 g by mouth 4 (four) times daily. 01/07/17  Yes [provider]  tamsulosin (FLOMAX) 0.4 MG CAPS capsule Take 0.4 mg by mouth daily. 01/07/17  Yes [provider]  baclofen (LIORESAL) 10 MG tablet Take 1 tablet (10 mg total) 3 (three) times daily by mouth. Patient not taking: Reported on 04/09/2017 03/11/17   Gillis Santa, MD    Allergies as of 08/21/2017  . (No Known Allergies)    Family History  Problem Relation Age of Onset  . Stroke Mother   . Diabetes Mother   . Heart disease Father   . Cancer Sister     Social History   Socioeconomic History  . Marital status: Married    Spouse name: Not on file  . Number of children: Not on file  . Years of education:  Not on file  . Highest education level: Not on file  Occupational History  . Not on file  Social Needs  . Financial resource strain: Not on file  . Food insecurity:    Worry: Not on file    Inability: Not on file  . Transportation needs:    Medical: Not on file    Non-medical: Not on file  Tobacco Use  . Smoking status: Never Smoker  . Smokeless tobacco: Never Used  Substance and Sexual Activity  . Alcohol use: Yes    Comment: infrequently  . Drug use: No  . Sexual activity: Not on file  Lifestyle  . Physical activity:    Days per week: Not on file    Minutes per session: Not on file  . Stress: Not on file  Relationships  . Social connections:    Talks on phone: Not on file    Gets together: Not on file    Attends religious service: Not on file    Active member of club or organization: Not on file    Attends meetings of clubs or organizations: Not on file    Relationship status: Not on file  . Intimate partner violence:    Fear of current or ex partner: Not on file    Emotionally abused: Not on file    Physically abused: Not  on file    Forced sexual activity: Not on file  Other Topics Concern  . Not on file  Social History Narrative  . Not on file    Review of Systems: See HPI, otherwise negative ROS  Physical Exam: BP 132/77   Pulse 71   Temp (!) 96 F (35.6 C) (Tympanic)   Resp 18   Ht 5\' 9"  (1.753 m)   Wt 104.3 kg (230 lb)   SpO2 97%   BMI 33.97 kg/m  General:   Alert,  pleasant and cooperative in NAD Head:  Normocephalic and atraumatic. Neck:  Supple; no masses or thyromegaly. Lungs:  Clear throughout to auscultation.    Heart:  Regular rate and rhythm. Abdomen:  Soft, nontender and nondistended. Normal bowel sounds, without guarding, and without rebound.   Neurologic:  Alert and  oriented x4;  grossly normal neurologically.  Impression/Plan: Larry Shaffer is here for an endoscopy and colonoscopy to be performed for screening colonoscopy and  history of gastric ulcer.  Risks, benefits, limitations, and alternatives regarding  endoscopy and colonoscopy have been reviewed with the patient.  Questions have been answered.  All parties agreeable.   Gaylyn Cheers, MD  12/02/2017, 9:33 AM

## 2017-12-02 NOTE — Op Note (Signed)
[facility] Patient Name: Larry Shaffer Procedure Date: 12/02/2017 9:37 AM MRN: [id number] Account #: [id number] Date of Birth: 29-Aug-1940 Admit Type: Outpatient Age: 77 Room: [facility] ENDO ROOM 3 Gender: Male Note Status: Finalized Procedure:            Upper GI endoscopy Indications:          Follow-up of gastric ulcer Providers:            Manya Silvas, MD Referring MD:         Tracie Harrier, MD (Referring MD) Medicines:            Propofol per Anesthesia Complications:        No immediate complications. Procedure:            Pre-Anesthesia Assessment:                       - After reviewing the risks and benefits, the patient                        was deemed in satisfactory condition to undergo the                        procedure.                       After obtaining informed consent, the endoscope was                        passed under direct vision. Throughout the procedure,                        the patient's blood pressure, pulse, and oxygen                        saturations were monitored continuously. The Endoscope                        was introduced through the mouth, and advanced to the                        second part of duodenum. The upper GI endoscopy was                        somewhat difficult. The patient tolerated the procedure. Findings:      The examined esophagus was normal.      A small hiatal hernia was present.      The entire examined stomach was normal.      The duodenal bulb was normal. Impression:           - Normal esophagus.                       - Small hiatal hernia.                       - Normal stomach.                       - Normal duodenal bulb.                       - No specimens collected. Recommendation:       -  Perform a colonoscopy as previously scheduled. Manya Silvas, MD 12/02/2017 9:51:13 AM This report has been signed electronically. Number of Addenda: 0 Note Initiated On:  12/02/2017 9:37 AM      [facility]

## 2017-12-02 NOTE — Op Note (Signed)
[facility] Gastroenterology Patient Name: Larry Shaffer Procedure Date: 12/02/2017 9:36 AM MRN: [id number] Account #: [id number] Date of Birth: 1941-01-28 Admit Type: Outpatient Age: 77 Room: [facility] ENDO ROOM 3 Gender: Male Note Status: Finalized Procedure:            Colonoscopy Indications:          Screening for colorectal malignant neoplasm Providers:            Manya Silvas, MD Referring MD:         Tracie Harrier, MD (Referring MD) Medicines:            Propofol per Anesthesia Complications:        No immediate complications. Procedure:            Pre-Anesthesia Assessment:                       - After reviewing the risks and benefits, the patient                        was deemed in satisfactory condition to undergo the                        procedure.                       After obtaining informed consent, the colonoscope was                        passed under direct vision. Throughout the procedure,                        the patient's blood pressure, pulse, and oxygen                        saturations were monitored continuously. The                        Colonoscope was introduced through the anus and                        advanced to the the cecum, identified by appendiceal                        orifice and ileocecal valve. The colonoscopy was                        performed without difficulty. The patient tolerated the                        procedure well. The quality of the bowel preparation                        was good. Findings:      A diminutive polyp was found in the ascending colon. The polyp was       sessile. The polyp was removed with a jumbo cold forceps. Resection and       retrieval were complete.      Many small-mouthed diverticula were found in the sigmoid colon and       descending colon.      Internal hemorrhoids were found during endoscopy. The hemorrhoids were  small and Grade I (internal hemorrhoids that do  not prolapse).      The exam was otherwise without abnormality. Impression:           - One diminutive polyp in the ascending colon, removed                        with a jumbo cold forceps. Resected and retrieved.                       - Diverticulosis in the sigmoid colon and in the                        descending colon.                       - Internal hemorrhoids.                       - The examination was otherwise normal. Recommendation:       - Await pathology results. Manya Silvas, MD 12/02/2017 10:15:57 AM This report has been signed electronically. Number of Addenda: 0 Note Initiated On: 12/02/2017 9:36 AM Scope Withdrawal Time: 0 hours 13 minutes 44 seconds  Total Procedure Duration: 0 hours 17 minutes 35 seconds       [facility]

## 2017-12-03 ENCOUNTER — Encounter: Payer: Self-pay | Admitting: Unknown Physician Specialty

## 2017-12-03 LAB — SURGICAL PATHOLOGY

## 2017-12-03 NOTE — Anesthesia Postprocedure Evaluation (Signed)
Anesthesia Post Note  Patient: Larry Shaffer  Procedure(s) Performed: ESOPHAGOGASTRODUODENOSCOPY (EGD) WITH PROPOFOL (N/A ) COLONOSCOPY WITH PROPOFOL (N/A )  Patient location during evaluation: PACU Anesthesia Type: General Level of consciousness: awake and alert Pain management: pain level controlled Vital Signs Assessment: post-procedure vital signs reviewed and stable Respiratory status: spontaneous breathing, nonlabored ventilation, respiratory function stable and patient connected to nasal cannula oxygen Cardiovascular status: blood pressure returned to baseline and stable Postop Assessment: no apparent nausea or vomiting Anesthetic complications: no     Last Vitals:  Vitals:   12/02/17 1040 12/02/17 1050  BP: 135/75 139/78  Pulse:  67  Resp:  18  Temp:    SpO2:  96%    Last Pain:  Vitals:   12/03/17 0736  TempSrc:   PainSc: 0-No pain                 Molli Barrows

## 2018-06-09 ENCOUNTER — Other Ambulatory Visit: Payer: Self-pay

## 2018-06-09 ENCOUNTER — Emergency Department: Payer: Medicare Other

## 2018-06-09 ENCOUNTER — Emergency Department
Admission: EM | Admit: 2018-06-09 | Discharge: 2018-06-09 | Disposition: A | Discharge status: Home / Self Care | Payer: Medicare Other | Attending: Emergency Medicine | Admitting: Emergency Medicine

## 2018-06-09 DIAGNOSIS — E119 Type 2 diabetes mellitus without complications: Secondary | ICD-10-CM | POA: Diagnosis not present

## 2018-06-09 DIAGNOSIS — Z7984 Long term (current) use of oral hypoglycemic drugs: Secondary | ICD-10-CM | POA: Insufficient documentation

## 2018-06-09 DIAGNOSIS — H5712 Ocular pain, left eye: Secondary | ICD-10-CM | POA: Diagnosis present

## 2018-06-09 DIAGNOSIS — Z79899 Other long term (current) drug therapy: Secondary | ICD-10-CM | POA: Insufficient documentation

## 2018-06-09 DIAGNOSIS — L03213 Periorbital cellulitis: Secondary | ICD-10-CM | POA: Diagnosis not present

## 2018-06-09 LAB — COMPREHENSIVE METABOLIC PANEL
ALBUMIN: 4.4 g/dL (ref 3.5–5.0)
ALT: 29 U/L (ref 0–44)
ANION GAP: 8 (ref 5–15)
AST: 24 U/L (ref 15–41)
Alkaline Phosphatase: 59 U/L (ref 38–126)
BUN: 22 mg/dL (ref 8–23)
CO2: 27 mmol/L (ref 22–32)
Calcium: 9.1 mg/dL (ref 8.9–10.3)
Chloride: 101 mmol/L (ref 98–111)
Creatinine, Ser: 1.4 mg/dL — ABNORMAL HIGH (ref 0.61–1.24)
GFR calc Af Amer: 56 mL/min — ABNORMAL LOW (ref >60)
GFR calc non Af Amer: 48 mL/min — ABNORMAL LOW (ref >60)
GLUCOSE: 263 mg/dL — AB (ref 70–99)
POTASSIUM: 4.5 mmol/L (ref 3.5–5.1)
SODIUM: 136 mmol/L (ref 135–145)
Total Bilirubin: 0.9 mg/dL (ref 0.3–1.2)
Total Protein: 7.5 g/dL (ref 6.5–8.1)

## 2018-06-09 LAB — CBC WITH DIFFERENTIAL/PLATELET
Abs Immature Granulocytes: 0.07 10*3/uL (ref 0.00–0.07)
BASOS ABS: 0.1 10*3/uL (ref 0.0–0.1)
BASOS PCT: 1 %
EOS ABS: 0.2 10*3/uL (ref 0.0–0.5)
EOS PCT: 2 %
HCT: 46.2 % (ref 39.0–52.0)
Hemoglobin: 15.8 g/dL (ref 13.0–17.0)
Immature Granulocytes: 1 %
LYMPHS ABS: 2.2 10*3/uL (ref 0.7–4.0)
Lymphocytes Relative: 23 %
MCH: 33.3 pg (ref 26.0–34.0)
MCHC: 34.2 g/dL (ref 30.0–36.0)
MCV: 97.5 fL (ref 80.0–100.0)
MONO ABS: 1.1 10*3/uL — AB (ref 0.1–1.0)
MONOS PCT: 12 %
Neutro Abs: 5.9 10*3/uL (ref 1.7–7.7)
Neutrophils Relative %: 61 %
Platelets: 285 10*3/uL (ref 150–400)
RBC: 4.74 MIL/uL (ref 4.22–5.81)
RDW: 11.9 % (ref 11.5–15.5)
WBC: 9.5 10*3/uL (ref 4.0–10.5)
nRBC: 0 % (ref 0.0–0.2)

## 2018-06-09 MED ORDER — IOHEXOL 300 MG/ML  SOLN
75.0000 mL | Freq: Once | INTRAMUSCULAR | Status: AC | PRN
  Administered 2018-06-09: 75 mL via INTRAVENOUS

## 2018-06-09 MED ORDER — CLINDAMYCIN HCL 150 MG PO CAPS
450.0000 mg | ORAL_CAPSULE | Freq: Once | ORAL | Status: AC
  Administered 2018-06-09: 450 mg via ORAL
  Filled 2018-06-09: qty 3

## 2018-06-09 MED ORDER — SODIUM CHLORIDE 0.9 % IV BOLUS
500.0000 mL | Freq: Once | INTRAVENOUS | Status: AC
  Administered 2018-06-09: 500 mL via INTRAVENOUS

## 2018-06-09 MED ORDER — CLINDAMYCIN HCL 150 MG PO CAPS: 450.0000 mg | ORAL_CAPSULE | Freq: Three times a day (TID) | ORAL | 0 refills | Status: AC

## 2018-06-09 NOTE — ED Triage Notes (Signed)
Sent from [facility] with concern for cellulitis.  Has redness and swelling which started last Wednesday.

## 2018-06-09 NOTE — ED Provider Notes (Signed)
[facility] Emergency Department Provider Note  ___________________________________________   First MD Initiated Contact with Patient 06/09/18 1230     (approximate)  I have reviewed the triage vital signs and the nursing notes.   HISTORY  Chief Complaint Eye Pain   HPI Larry Shaffer is a 78 y.o. male with a history of diabetes, hyperlipidemia and obesity who is presenting emergency department today with 1 week of worsening left eye redness and swelling.  He says that it started with an upper respiratory illness which consisted of sneezing.  He was put on amoxicillin by his primary care doctor and then because of the increasing redness as well as pain with moving the left eye followed up at [facility] today.  He was sent to the emergency department by Dr. Neville Route to rule out orbital cellulitis.  Patient does not report fever or body aches.  States that he has no problems with his vision but that he does have crusting over the eye in the morning when he wakes up.  Says that he took his last dose of amoxicillin this morning.   Past Medical History:  Diagnosis Date  . Diabetes mellitus without complication ([place])   . Gastric ulcer   . Gastric ulcer   . Hyperlipidemia   . Hyperlipidemia   . Obesity     Patient Active Problem List   Diagnosis Date Noted  . Lumbar radiculopathy 02/04/2017  . 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 Spinal stenosis of lumbar region without neurogenic claudication 02/04/2017  . Acute right-sided low back pain with right-sided sciatica 02/04/2017    Past Surgical History:  Procedure Laterality Date  . COLONOSCOPY WITH PROPOFOL N/A 12/02/2017   Procedure: COLONOSCOPY WITH PROPOFOL;  Surgeon: Manya Silvas, MD;  Location: [facility] ENDOSCOPY;  Service: Endoscopy;  Laterality: N/A;  . ESOPHAGOGASTRODUODENOSCOPY (EGD) WITH PROPOFOL N/A 12/02/2017   Procedure: ESOPHAGOGASTRODUODENOSCOPY  (EGD) WITH PROPOFOL;  Surgeon: Manya Silvas, MD;  Location: [facility] ENDOSCOPY;  Service: Endoscopy;  Laterality: N/A;  . KNEE ARTHROSCOPY Bilateral 2003, 2004  . tonsillectomty  1947  . TONSILLECTOMY    . TREATMENT FISTULA ANAL      Prior to Admission medications   Medication Sig Start Date End Date Taking? Authorizing Provider  atorvastatin (LIPITOR) 20 MG tablet Take 1 tablet by mouth daily. 01/07/17   [provider]  baclofen (LIORESAL) 10 MG tablet Take 1 tablet (10 mg total) 3 (three) times daily by mouth. Patient not taking: Reported on 04/09/2017 03/11/17   Gillis Santa, MD  famciclovir (FAMVIR) 500 MG tablet Take 1 tablet by mouth 2 (two) times daily as needed. 01/07/17   [provider]  glipiZIDE (GLUCOTROL XL) 10 MG 24 hr tablet Take 10 mg by mouth daily. 01/07/17 01/07/18  [provider]  pantoprazole (PROTONIX) 40 MG tablet Take 1 tablet by mouth daily. 01/15/17   [provider]  sucralfate (CARAFATE) 1 g tablet Take 1 g by mouth 4 (four) times daily. 01/07/17   [provider]  tamsulosin (FLOMAX) 0.4 MG CAPS capsule Take 0.4 mg by mouth daily. 01/07/17   [provider]    Allergies Patient has no known allergies.  Family History  Problem Relation Age of Onset  . Stroke Mother   . Diabetes Mother   . Heart disease Father   . Cancer Sister     Social History Social History   Tobacco Use  . Smoking status: Never Smoker  .  Smokeless tobacco: Never Used  Substance Use Topics  . Alcohol use: Yes    Comment: infrequently  . Drug use: No    Review of Systems  Constitutional: No fever/chills Eyes: As above ENT: No sore throat. Cardiovascular: Denies chest pain. Respiratory: Denies shortness of breath. Gastrointestinal: No abdominal pain.  No nausea, no vomiting.  No diarrhea.  No constipation. Genitourinary: Negative for dysuria. Musculoskeletal: Negative for back pain. Skin: Negative for  rash. Neurological: Negative for headaches, focal weakness or numbness.   ____________________________________________   PHYSICAL EXAM:  VITAL SIGNS: ED Triage Vitals  Enc Vitals Group     BP 06/09/18 1223 (!) 159/103     Pulse Rate 06/09/18 1223 80     Resp 06/09/18 1223 18     Temp 06/09/18 1223 97.8 F (36.6 C)     Temp Source 06/09/18 1223 Oral     SpO2 06/09/18 1223 94 %     Weight 06/09/18 1224 235 lb (106.6 kg)     Height 06/09/18 1224 5\' 8"  (1.727 m)     Head Circumference --      Peak Flow --      Pain Score 06/09/18 1223 6     Pain Loc --      Pain Edu? --      Excl. in [place]? --     Constitutional: Alert and oriented. Well appearing and in no acute distress. Eyes: Normal exam to the right eye.  Left eye with periorbital swelling as well as erythema with a small amount of yellow crusting around the perimeter of the left eye.  However, he is able to open his eyelid fully to the left side.  EOMI but he says with a small amount of pain to the superior aspect of the eye when he moves the left eye.  Pupils are equal and round.  Left eye with erythematous conjunctive a.  Also with mild to moderate edema to the conjunctiva. Head: Atraumatic. Nose: No congestion/rhinnorhea. Mouth/Throat: Mucous membranes are moist.  Neck: No stridor.   Cardiovascular: Normal rate, regular rhythm. Grossly normal heart sounds.   Respiratory: Normal respiratory effort.  No retractions. Lungs CTAB. Gastrointestinal: Soft and nontender. No distention.  Musculoskeletal: No lower extremity tenderness nor edema.  No joint effusions. Neurologic:  Normal speech and language. No gross focal neurologic deficits are appreciated. Skin:  Skin is warm, dry and intact. No rash noted. Psychiatric: Mood and affect are normal. Speech and behavior are normal.  ____________________________________________   LABS (all labs ordered are listed, but only abnormal results are displayed)  Labs Reviewed  CBC WITH  DIFFERENTIAL/PLATELET - Abnormal; Notable for the following components:      Result Value   Monocytes Absolute 1.1 (*)    All other components within normal limits  COMPREHENSIVE METABOLIC PANEL - Abnormal; Notable for the following components:   Glucose, Bld 263 (*)    Creatinine, Ser 1.40 (*)    GFR calc non Af Amer 48 (*)    GFR calc Af Amer 56 (*)    All other components within normal limits   ____________________________________________  EKG   ____________________________________________  RADIOLOGY  CT of the orbits with superficial periorbital cellulitis on the left.  No evidence of preseptal orbital inflammation. ____________________________________________   PROCEDURES  Procedure(s) performed:   Procedures  Critical Care performed:   ____________________________________________   INITIAL IMPRESSION / ASSESSMENT AND PLAN / ED COURSE  Pertinent labs & imaging results that were available during my care of  the patient were reviewed by me and considered in my medical decision making (see chart for details).  DDX: Conjunctivitis, orbital edema, periorbital cellulitis, orbital cellulitis ----------------------------------------- 2:58 PM on 06/09/2018 -----------------------------------------  I discussed the case with Dr. Neville Route at [facility] eye was already given the patient Vigamox and instructed the patient to use it 4 times daily for 7 days.  I will discharge the patient with clindamycin as he did not have improvement of his symptoms with amoxicillin.  He will follow back up with Dr. Tanda Rockers.  He is understanding of the diagnosis well treatment and willing to comply.  Will be discharged at this time.  ____________________________________________   FINAL CLINICAL IMPRESSION(S) / ED DIAGNOSES  Periorbital cellulitis.  NEW MEDICATIONS STARTED DURING THIS VISIT:  New Prescriptions   No medications on file     Note:  This document was prepared using Dragon  voice recognition software and may include unintentional dictation errors.     Orbie Pyo, MD 06/09/18 1501

## 2018-06-09 NOTE — ED Triage Notes (Addendum)
Pt comes via POV from [facility] for orbital cellulitis.   Pt has swelling, redness, itching and burning to left eye. Pt also states pressure and it feels like a bruise.  Pt states this started about a week ago. Pt states he has taken OTC medications with no relief. Pt currently on amoxicillin at this time.

## 2018-10-01 ENCOUNTER — Telehealth: Payer: Self-pay | Admitting: *Deleted

## 2018-10-01 NOTE — Telephone Encounter (Signed)
Please schedule a vv with Dr Holley Raring.  The patient has not been here since 2018.  Thank you

## 2018-10-02 ENCOUNTER — Telehealth: Payer: Self-pay

## 2018-10-02 NOTE — Telephone Encounter (Signed)
Scheduled for monday

## 2018-10-02 NOTE — Telephone Encounter (Signed)
Called patient for mondays visit and he would like for Dr Holley Raring to know that he is having problems with incontience and has a apt with a urologist 11/05/2018

## 2018-10-06 ENCOUNTER — Encounter: Payer: Self-pay | Admitting: Student in an Organized Health Care Education/Training Program

## 2018-10-06 ENCOUNTER — Other Ambulatory Visit: Payer: Self-pay

## 2018-10-06 ENCOUNTER — Ambulatory Visit
Payer: Medicare Other | Attending: Student in an Organized Health Care Education/Training Program | Admitting: Student in an Organized Health Care Education/Training Program

## 2018-10-06 DIAGNOSIS — M5416 Radiculopathy, lumbar region: Secondary | ICD-10-CM

## 2018-10-06 DIAGNOSIS — G8929 Other chronic pain: Secondary | ICD-10-CM | POA: Diagnosis not present

## 2018-10-06 DIAGNOSIS — M48061 Spinal stenosis, lumbar region without neurogenic claudication: Secondary | ICD-10-CM

## 2018-10-06 NOTE — Progress Notes (Signed)
Pain Management Virtual Encounter Note - Virtual Visit via [place] (real-time audio visits between healthcare provider and patient).   Patient's Phone No. & Preferred Pharmacy:  [phone] (home); [phone] (mobile); (Preferred) [phone] [email or web address]  [id number] [address], [place] [address] Phone: [phone] Fax: [phone]    Pre-screening note:  Our staff contacted Larry Shaffer and offered him an "in person", "face-to-face" appointment versus a telephone encounter. He indicated preferring the telephone encounter, at this time.   Reason for Virtual Visit: COVID-19*  Social distancing based on CDC and AMA recommendations.   I contacted Larry Shaffer on 10/06/2018 via video conference.      I clearly identified myself as Gillis Santa, MD. I verified that I was speaking with the correct person using two identifiers (Name: Larry Shaffer, and date of birth: 1941/04/16).  Advanced Informed Consent I sought verbal advanced consent from Larry Shaffer for virtual visit interactions. I informed Larry Shaffer of possible security and privacy concerns, risks, and limitations associated with providing "not-in-person" medical evaluation and management services. I also informed Larry Shaffer of the availability of "in-person" appointments. Finally, I informed him that there would be a charge for the virtual visit and that he could be  personally, fully or partially, financially responsible for it. Larry Shaffer expressed understanding and agreed to proceed.   Historic Elements   Larry Shaffer is a 78 y.o. year old, male patient evaluated today after his last encounter by our practice on 10/02/2018. Larry Shaffer  has a past medical history of Diabetes mellitus without complication ([place]), Gastric ulcer, Gastric ulcer, Hyperlipidemia, Hyperlipidemia, and Obesity. He also  has a past surgical history that includes  Knee arthroscopy (Bilateral, 2003, 2004); tonsillectomty (1947); Tonsillectomy; Treatment fistula anal; Esophagogastroduodenoscopy (egd) with propofol (N/A, 12/02/2017); and Colonoscopy with propofol (N/A, 12/02/2017). Larry Shaffer has a current medication list which includes the following prescription(s): atorvastatin, famciclovir, pantoprazole, sucralfate, tamsulosin, baclofen, and glipizide. He  reports that he has never smoked. He has never used smokeless tobacco. He reports current alcohol use. He reports that he does not use drugs. Larry Shaffer has No Known Allergies.   HPI  Today, he is being contacted for worsening of previously known (established) problem  Worsening back pain, difficulty walking. Pain does radiate bilateral Takes Aleve in the AM, helps. Previous has L5-S1 ESI #2 on 04/09/17 which was very helpful for 10-11 months.  Patient now endorses worsening pain as well as worsening lower extremity weakness.  He also endorses incontinence and urinary frequency.  I had the patient elaborate on this and he states that he is using a diaper because he is voiding so frequently.  While this could be stemming from a spinal source and recommend the patient treated appointment with Dr. Cari Caraway for evaluation, this could also be due to higher blood glucose levels which the patient endorses.  I explained to the patient how increased blood glucose can cause polyuria and increased frequency.  I encouraged the patient to follow-up with his primary care provider to find strategies to optimize his blood glucose management.  From my clinical discussion with the patient, it seems that his urinary frequency stemming from increased blood glucose however may not be a bad idea for the patient to see Dr. Cari Caraway with neurosurgery who he has seen last on 02/28/2017.  In the interim, we discussed repeating lumbar epidural steroid injection at L5-S1 which he has had in the  past with me that provided significant pain  relief.  Patient endorsed understanding.  Pertinent Labs   SAFETY SCREENING Profile No results found for: SARSCOV2NAA, COVIDSOURCE, STAPHAUREUS, MRSAPCR, HCVAB, HIV, PREGTESTUR Renal Function Lab Results  Component Value Date   BUN 22 06/09/2018   CREATININE 1.40 (H) 06/09/2018   GFRAA 56 (L) 06/09/2018   GFRNONAA 48 (L) 06/09/2018   Hepatic Function Lab Results  Component Value Date   AST 24 06/09/2018   ALT 29 06/09/2018   ALBUMIN 4.4 06/09/2018   UDS No results found for: SUMMARY Note: Above Lab results reviewed.  Recent imaging  CT Orbits W Contrast CLINICAL DATA:  Redness and swelling on the left with concern for orbital cellulitis.  EXAM: CT ORBITS WITH CONTRAST  TECHNIQUE: Multidetector CT images was performed according to the standard protocol following intravenous contrast administration.  CONTRAST:  65mL OMNIPAQUE IOHEXOL 300 MG/ML  SOLN  COMPARISON:  None.  FINDINGS: Orbits: Mild superficial periorbital soft tissue swelling on the left. No evidence of postseptal inflammatory change. The globe is normal. Optic nerves are normal. Extra-ocular muscles are normal. Orbital fat is normal. Lacrimal glands are normal.  Visualized sinuses: Clear  Soft tissues: Otherwise normal.  Limited intracranial: Normal for age.  IMPRESSION: Superficial periorbital cellulitis on the left. No evidence of postseptal orbital inflammation.  Electronically Signed   By: Nelson Chimes M.D.   On: 06/09/2018 14:21  Assessment  The primary encounter diagnosis was Lumbar radiculopathy. Diagnoses of Chronic radicular lumbar pain and Spinal stenosis of lumbar region without neurogenic claudication were also pertinent to this visit.  Plan of Care  I am having Larry Shaffer maintain his atorvastatin, famciclovir, glipiZIDE, sucralfate, pantoprazole, tamsulosin, and baclofen.  Orders:  Orders Placed This Encounter  Procedures  . Lumbar Epidural Injection    Standing  Status:   Future    Standing Expiration Date:   11/05/2018    Scheduling Instructions:     Procedure: Interlaminar Lumbar Epidural Steroid injection (LESI)  L5-S1     Laterality: Midline     Sedation: None required.     Timeframe: ASAP    Order Specific Question:   Where will this procedure be performed?    Answer:   [facility]   Informed patient's of red flags which include new and severe lower extremity weakness, worsening bowel bladder incontinence.  If this worsens, encouraged the patient to seek urgent care at ED.  Follow-up plan:   Return for Procedure: L5/S1 ESI.    I discussed the assessment and treatment plan with the patient. The patient was provided an opportunity to ask questions and all were answered. The patient agreed with the plan and demonstrated an understanding of the instructions.  Patient advised to call back or seek an in-person evaluation if the symptoms or condition worsens.  Total duration of non-face-to-face encounter: 75minutes.  Note by: Gillis Santa, MD Date: 10/06/2018; Time: 1:21 PM  Note: This dictation was prepared with Dragon dictation. Any transcriptional errors that may result from this process are unintentional.  Disclaimer:  * Given the special circumstances of the COVID-19 pandemic, the federal government has announced that the Office for Civil Rights (OCR) will exercise its enforcement discretion and will not impose penalties on physicians using telehealth in the event of noncompliance with regulatory requirements under the [place] (HIPAA) in connection with the good faith provision of telehealth during the PPJKD-32 national public health emergency. ([place])

## 2018-10-22 ENCOUNTER — Other Ambulatory Visit
Admission: RE | Admit: 2018-10-22 | Discharge: 2018-10-22 | Disposition: A | Discharge status: Home / Self Care | Payer: Medicare Other | Source: Ambulatory Visit | Attending: Student in an Organized Health Care Education/Training Program | Admitting: Student in an Organized Health Care Education/Training Program

## 2018-10-22 ENCOUNTER — Other Ambulatory Visit: Payer: Self-pay

## 2018-10-22 DIAGNOSIS — Z1159 Encounter for screening for other viral diseases: Secondary | ICD-10-CM | POA: Insufficient documentation

## 2018-10-23 LAB — NOVEL CORONAVIRUS, NAA (HOSP ORDER, SEND-OUT TO REF LAB; TAT 18-24 HRS): SARS-CoV-2, NAA: NOT DETECTED

## 2018-10-27 ENCOUNTER — Encounter: Payer: Self-pay | Admitting: Student in an Organized Health Care Education/Training Program

## 2018-10-27 ENCOUNTER — Ambulatory Visit (HOSPITAL_BASED_OUTPATIENT_CLINIC_OR_DEPARTMENT_OTHER): Payer: Medicare Other | Admitting: Student in an Organized Health Care Education/Training Program

## 2018-10-27 ENCOUNTER — Other Ambulatory Visit: Payer: Self-pay

## 2018-10-27 ENCOUNTER — Ambulatory Visit
Admission: RE | Admit: 2018-10-27 | Discharge: 2018-10-27 | Disposition: A | Discharge status: Home / Self Care | Payer: Medicare Other | Source: Ambulatory Visit | Attending: Student in an Organized Health Care Education/Training Program | Admitting: Student in an Organized Health Care Education/Training Program

## 2018-10-27 DIAGNOSIS — M5416 Radiculopathy, lumbar region: Secondary | ICD-10-CM | POA: Insufficient documentation

## 2018-10-27 MED ORDER — ROPIVACAINE HCL 2 MG/ML IJ SOLN
INTRAMUSCULAR | Status: AC
  Filled 2018-10-27: qty 10

## 2018-10-27 MED ORDER — SODIUM CHLORIDE 0.9% FLUSH
1.0000 mL | Freq: Once | INTRAVENOUS | Status: AC
  Administered 2018-10-27: 10:00:00 1 mL

## 2018-10-27 MED ORDER — ROPIVACAINE HCL 2 MG/ML IJ SOLN
1.0000 mL | Freq: Once | INTRAMUSCULAR | Status: AC
  Administered 2018-10-27: 1 mL via EPIDURAL

## 2018-10-27 MED ORDER — SODIUM CHLORIDE (PF) 0.9 % IJ SOLN
INTRAMUSCULAR | Status: AC
  Filled 2018-10-27: qty 10

## 2018-10-27 MED ORDER — DEXAMETHASONE SODIUM PHOSPHATE 10 MG/ML IJ SOLN
10.0000 mg | Freq: Once | INTRAMUSCULAR | Status: AC
  Administered 2018-10-27: 10 mg

## 2018-10-27 MED ORDER — DEXAMETHASONE SODIUM PHOSPHATE 10 MG/ML IJ SOLN
INTRAMUSCULAR | Status: AC
  Filled 2018-10-27: qty 1

## 2018-10-27 MED ORDER — IOHEXOL 180 MG/ML  SOLN
10.0000 mL | Freq: Once | INTRAMUSCULAR | Status: AC
  Administered 2018-10-27: 10:00:00 10 mL via EPIDURAL

## 2018-10-27 MED ORDER — LIDOCAINE HCL 2 % IJ SOLN
INTRAMUSCULAR | Status: AC
  Filled 2018-10-27: qty 20

## 2018-10-27 MED ORDER — LIDOCAINE HCL 2 % IJ SOLN
20.0000 mL | Freq: Once | INTRAMUSCULAR | Status: AC
  Administered 2018-10-27: 400 mg

## 2018-10-27 NOTE — Progress Notes (Signed)
Safety precautions to be maintained throughout the outpatient stay will include: orient to surroundings, keep bed in low position, maintain call bell within reach at all times, provide assistance with transfer out of bed and ambulation.  

## 2018-10-27 NOTE — Patient Instructions (Signed)

## 2018-10-27 NOTE — Progress Notes (Signed)
Patient's Name: Larry Shaffer  MRN: [id number]  Referring Provider: Gillis Santa, MD  DOB: 12/05/1940  PCP: Tracie Harrier, MD  DOS: 10/27/2018  Note by: Gillis Santa, MD  Service setting: Ambulatory outpatient  Specialty: Interventional Pain Management  Patient type: Established  Location: [facility] (AMB) Pain Management Facility  Visit type: Interventional Procedure   Primary Reason for Visit: Interventional Pain Management Treatment. CC: Back Pain (lower)  Procedure:  Anesthesia, Analgesia, Anxiolysis:  Type: Therapeutic Inter-Laminar Epidural Steroid Injection #3 ( previous one 03/25/2017) Region: Lumbar Level: L5-S1 Level. Laterality: Midline         Type: Local Anesthesia Local Anesthetic: Lidocaine 1% Route: Infiltration ([facility]/IM) IV Access: Declined Sedation: Meaningful verbal contact was maintained at all times during the procedure  Indication(s): Analgesia and Anxiety   Indications: 1. Lumbar radiculopathy    Pain Score: Pre-procedure: 8 /10 Post-procedure: 5 /10  Pre-op Assessment:  Larry Shaffer is a 78 y.o. (year old), male patient, seen today for interventional treatment. He  has a past surgical history that includes Knee arthroscopy (Bilateral, 2003, 2004); tonsillectomty (1947); Tonsillectomy; Treatment fistula anal; Esophagogastroduodenoscopy (egd) with propofol (N/A, 12/02/2017); and Colonoscopy with propofol (N/A, 12/02/2017). Larry Shaffer has a current medication list which includes the following prescription(s): atorvastatin, trulicity, famciclovir, glipizide, pantoprazole, sucralfate, tamsulosin, and baclofen. His primarily concern today is the Back Pain (lower)  Initial Vital Signs: Blood pressure (!) 144/77, pulse 67, temperature 97.8 F (36.6 C), temperature source Oral, resp. rate 16, height 5\' 8"  (1.727 m), weight 230 lb (104.3 kg), SpO2 97 %. BMI: Estimated body mass index is 34.7 kg/m as calculated from the following:   Height as of this encounter: 5\' 9"  (1.753  m).   Weight as of this encounter: 235 lb (106.6 kg).  Risk Assessment: Allergies: Reviewed. He has No Known Allergies.  Allergy Precautions: None required Coagulopathies: Reviewed. None identified.  Blood-thinner therapy: None at this time Active Infection(s): Reviewed. None identified. Larry Shaffer is afebrile  Site Confirmation: Larry Shaffer was asked to confirm the procedure and laterality before marking the site Procedure checklist: Completed Consent: Before the procedure and under the influence of no sedative(s), amnesic(s), or anxiolytics, the patient was informed of the treatment options, risks and possible complications. To fulfill our ethical and legal obligations, as recommended by the American Medical Association's Code of Ethics, I have informed the patient of my clinical impression; the nature and purpose of the treatment or procedure; the risks, benefits, and possible complications of the intervention; the alternatives, including doing nothing; the risk(s) and benefit(s) of the alternative treatment(s) or procedure(s); and the risk(s) and benefit(s) of doing nothing. The patient was provided information about the general risks and possible complications associated with the procedure. These may include, but are not limited to: failure to achieve desired goals, infection, bleeding, organ or nerve damage, allergic reactions, paralysis, and death. In addition, the patient was informed of those risks and complications associated to Spine-related procedures, such as failure to decrease pain; infection (i.e.: Meningitis, epidural or intraspinal abscess); bleeding (i.e.: epidural hematoma, subarachnoid hemorrhage, or any other type of intraspinal or peri-dural bleeding); organ or nerve damage (i.e.: Any type of peripheral nerve, nerve root, or spinal cord injury) with subsequent damage to sensory, motor, and/or autonomic systems, resulting in permanent pain, numbness, and/or weakness of one or  several areas of the body; allergic reactions; (i.e.: anaphylactic reaction); and/or death. Furthermore, the patient was informed of those risks and complications associated with the medications. These include, but are not  limited to: allergic reactions (i.e.: anaphylactic or anaphylactoid reaction(s)); adrenal axis suppression; blood sugar elevation that in diabetics may result in ketoacidosis or comma; water retention that in patients with history of congestive heart failure may result in shortness of breath, pulmonary edema, and decompensation with resultant heart failure; weight gain; swelling or edema; medication-induced neural toxicity; particulate matter embolism and blood vessel occlusion with resultant organ, and/or nervous system infarction; and/or aseptic necrosis of one or more joints. Finally, the patient was informed that Medicine is not an exact science; therefore, there is also the possibility of unforeseen or unpredictable risks and/or possible complications that may result in a catastrophic outcome. The patient indicated having understood very clearly. We have given the patient no guarantees and we have made no promises. Enough time was given to the patient to ask questions, all of which were answered to the patient's satisfaction. Larry Shaffer has indicated that he wanted to continue with the procedure. Attestation: I, the ordering provider, attest that I have discussed with the patient the benefits, risks, side-effects, alternatives, likelihood of achieving goals, and potential problems during recovery for the procedure that I have provided informed consent. Date: 10/27/2018; Time: 12:43 PM  Pre-Procedure Preparation:  Monitoring: As per clinic protocol. Respiration, ETCO2, SpO2, BP, heart rate and rhythm monitor placed and checked for adequate function Safety Precautions: Patient was assessed for positional comfort and pressure points before starting the procedure. Time-out: I initiated  and conducted the "Time-out" before starting the procedure, as per protocol. The patient was asked to participate by confirming the accuracy of the "Time Out" information. Verification of the correct person, site, and procedure were performed and confirmed by me, the nursing staff, and the patient. "Time-out" conducted as per Joint Commission's Universal Protocol (UP.01.01.01). "Time-out" Date & Time: 10/27/2018; 1016 hrs.  Description of Procedure Process:   Position: Prone with head of the table was raised to facilitate breathing. Target Area: The interlaminar space, initially targeting the lower laminar border of the superior vertebral body. Approach: Paramedial approach. Area Prepped: Entire Posterior Lumbar Region Prepping solution: ChloraPrep (2% chlorhexidine gluconate and 70% isopropyl alcohol) Safety Precautions: Aspiration looking for blood return was conducted prior to all injections. At no point did we inject any substances, as a needle was being advanced. No attempts were made at seeking any paresthesias. Safe injection practices and needle disposal techniques used. Medications properly checked for expiration dates. SDV (single dose vial) medications used. Description of the Procedure: Protocol guidelines were followed. The procedure needle was introduced through the skin, ipsilateral to the reported pain, and advanced to the target area. Bone was contacted and the needle walked caudad, until the lamina was cleared. The epidural space was identified using "loss-of-resistance technique" with 2-3 ml of PF-NaCl (0.9% NSS), in a 5cc LOR glass syringe. Vitals:   10/27/18 1020 10/27/18 1025 10/27/18 1030 10/27/18 1035  BP: (!) 168/101 (!) 169/100 (!) 157/93 (!) 164/97  Pulse: (!) 113 (!) 114 (!) 120 (!) 125  Resp: (!) 24 (!) 21 18 (!) 28  Temp:      TempSrc:      SpO2: 97% 98% 98% 99%  Weight:      Height:        Start Time: 1016 hrs. End Time: 0107 hrs. Materials:  Needle(s) Type:  Epidural needle Gauge: 17G Length: 3.5-in Medication(s): We administered iohexol, lidocaine, sodium chloride flush, ropivacaine (PF) 2 mg/mL (0.2%), and dexamethasone. Please see chart orders for dosing details. 5 cc of preservative-free normal saline, 2  cc of 0.2% ropivacaine, 1 cc of Decadron 10 mg/cc: 8 cc total Imaging Guidance (Spinal):  Type of Imaging Technique: Fluoroscopy Guidance (Spinal) Indication(s): Assistance in needle guidance and placement for procedures requiring needle placement in or near specific anatomical locations not easily accessible without such assistance. Exposure Time: Please see nurses notes. Contrast: Before injecting any contrast, we confirmed that the patient did not have an allergy to iodine, shellfish, or radiological contrast. Once satisfactory needle placement was completed at the desired level, radiological contrast was injected. Contrast injected under live fluoroscopy. No contrast complications. See chart for type and volume of contrast used. Fluoroscopic Guidance: I was personally present during the use of fluoroscopy. "Tunnel Vision Technique" used to obtain the best possible view of the target area. Parallax error corrected before commencing the procedure. "Direction-depth-direction" technique used to introduce the needle under continuous pulsed fluoroscopy. Once target was reached, antero-posterior, oblique, and lateral fluoroscopic projection used confirm needle placement in all planes. Images permanently stored in EMR. Interpretation: I personally interpreted the imaging intraoperatively. Adequate needle placement confirmed in multiple planes. Appropriate spread of contrast into desired area was observed. No evidence of afferent or efferent intravascular uptake. No intrathecal or subarachnoid spread observed. Permanent images saved into the patient's record.  Antibiotic Prophylaxis:  Indication(s): None identified Antibiotic given: None  Post-operative  Assessment:  EBL: None Complications: No immediate post-treatment complications observed by team, or reported by patient. Note: The patient tolerated the entire procedure well. A repeat set of vitals were taken after the procedure and the patient was kept under observation following institutional policy, for this type of procedure. Post-procedural neurological assessment was performed, showing return to baseline, prior to discharge. The patient was provided with post-procedure discharge instructions, including a section on how to identify potential problems. Should any problems arise concerning this procedure, the patient was given instructions to immediately contact us, at any time, without hesitation. In any case, we plan to contact the patient by telephone for a follow-up status report regarding this interventional procedure. Comments:  No additional relevant information. 5 out of 5 strength bilateral lower extremity: Plantar flexion, dorsiflexion, knee flexion, knee extension. Plan of Care    Imaging Orders     DG PAIN CLINIC C-ARM 1-60 MIN NO REPORT Procedure Orders    No procedure(s) ordered today    Medications ordered for procedure: Meds ordered this encounter  Medications  . iohexol (OMNIPAQUE) 180 MG/ML injection 10 mL    Must be Myelogram-compatible. If not available, you may substitute with a water-soluble, non-ionic, hypoallergenic, myelogram-compatible radiological contrast medium.  Marland Kitchen lidocaine (XYLOCAINE) 2 % (with pres) injection 400 mg  . sodium chloride flush (NS) 0.9 % injection 1 mL  . ropivacaine (PF) 2 mg/mL (0.2%) (NAROPIN) injection 1 mL  . dexamethasone (DECADRON) injection 10 mg   Medications administered: We administered iohexol, lidocaine, sodium chloride flush, ropivacaine (PF) 2 mg/mL (0.2%), and dexamethasone.  See the medical record for exact dosing, route, and time of administration.  New Prescriptions   No medications on file   Disposition: Discharge  home  Discharge Date & Time: 10/27/2018; 1042 hrs.   Physician-requested Follow-up: Return in about 4 weeks (around 11/24/2018) for Post Procedure Evaluation, virtual. Future Appointments  Date Time Provider [place]  11/05/2018  2:30 PM Hollice Espy, MD [facility] None  11/27/2018  8:45 AM Gillis Santa, MD [facility] None   Primary Care Physician: Tracie Harrier, MD Location: [facility] Note by: Gillis Santa, MD Date: 10/27/2018; Time:  12:27 PM  Disclaimer:  Medicine is not an Chief Strategy Officer. The only guarantee in medicine is that nothing is guaranteed. It is important to note that the decision to proceed with this intervention was based on the information collected from the patient. The Data and conclusions were drawn from the patient's questionnaire, the interview, and the physical examination. Because the information was provided in large part by the patient, it cannot be guaranteed that it has not been purposely or unconsciously manipulated. Every effort has been made to obtain as much relevant data as possible for this evaluation. It is important to note that the conclusions that lead to this procedure are derived in large part from the available data. Always take into account that the treatment will also be dependent on availability of resources and existing treatment guidelines, considered by other Pain Management Practitioners as being common knowledge and practice, at the time of the intervention. For Medico-Legal purposes, it is also important to point out that variation in procedural techniques and pharmacological choices are the acceptable norm. The indications, contraindications, technique, and results of the above procedure should only be interpreted and judged by a Board-Certified Interventional Pain Specialist with extensive familiarity and expertise in the same exact procedure and technique.

## 2018-10-28 ENCOUNTER — Telehealth: Payer: Self-pay

## 2018-10-28 NOTE — Telephone Encounter (Signed)
Talked with patient. Denies any needs at this time. Stated he felt well and was able to sleep well last night. Thanks everyone for be gentle and great care. Instructed to call if needed.

## 2018-11-05 ENCOUNTER — Encounter: Payer: Self-pay | Admitting: Urology

## 2018-11-05 ENCOUNTER — Ambulatory Visit (INDEPENDENT_AMBULATORY_CARE_PROVIDER_SITE_OTHER): Payer: Medicare Other | Admitting: Urology

## 2018-11-05 ENCOUNTER — Other Ambulatory Visit: Payer: Self-pay

## 2018-11-05 VITALS — BP 149/77 | HR 76 | Ht 69.0 in | Wt 244.0 lb

## 2018-11-05 DIAGNOSIS — N39490 Overflow incontinence: Secondary | ICD-10-CM | POA: Diagnosis not present

## 2018-11-05 DIAGNOSIS — N138 Other obstructive and reflux uropathy: Secondary | ICD-10-CM

## 2018-11-05 DIAGNOSIS — R339 Retention of urine, unspecified: Secondary | ICD-10-CM

## 2018-11-05 DIAGNOSIS — N401 Enlarged prostate with lower urinary tract symptoms: Secondary | ICD-10-CM

## 2018-11-05 LAB — URINALYSIS, COMPLETE
Bilirubin, UA: NEGATIVE
Ketones, UA: NEGATIVE
Nitrite, UA: NEGATIVE
Protein,UA: NEGATIVE
RBC, UA: NEGATIVE
Specific Gravity, UA: 1.02 (ref 1.005–1.030)
Urobilinogen, Ur: 0.2 mg/dL (ref 0.2–1.0)
pH, UA: 5 (ref 5.0–7.5)

## 2018-11-05 LAB — MICROSCOPIC EXAMINATION
RBC, Urine: NONE SEEN /hpf (ref 0–2)
WBC, UA: >30 /hpf — AB (ref 0–5)

## 2018-11-05 LAB — BLADDER SCAN AMB NON-IMAGING

## 2018-11-05 NOTE — Progress Notes (Signed)
11/05/2018 4:03 PM   Larry Shaffer 02-27-1941 [id number]  Referring provider: Tracie Harrier, [facility] [place],  [place] [address]  Chief Complaint  Patient presents with  . Urinary Retention    HPI: 78 year old male who presents today for worsening urinary incontinence.  He reports that over the past several months, he has had progressive leakage especially at nighttime which she can no longer control.  He reports he is dribbling constantly.  This seems to be worsening.  He has had several urinalysis by his primary care physician which show staph aureus but otherwise a fairly bland appearing urine which was nitrate positive with only 4-10 white blood cells per high-power field, otherwise unremarkable on 521.  He has had a similar appearing urinalysis in 04/2018 growing enterococcus.  He is also recently been prescribed nystatin for a groin rash.  Notably, the patient has a history of lumbar radiculopathy/spinal stenosis and is under the care of Dr. Cari Caraway.  At this point, they are trying to avoid surgical management and has been receiving injections into his lumbar spine.  He reports that surgery was considered, but he is having a worsening issue getting his diabetes/blood sugar under control.  Trulicity is recently been added to his regimen.  More recently, his primary care started him on Flomax which does not seem to help.  He denies any dysuria or gross hematuria.  No history of UTIs or bladder stones.  No personal history of BPH prior to this.  Most recent PSA 1.2 on 08/2018.  Most recent hemoglobin A1c 9.0 on 08/2018.  Bladder scan today shows greater than 1700 in his bladder.  PMH: Past Medical History:  Diagnosis Date  . Diabetes mellitus without complication ([place])   . Gastric ulcer   . Gastric ulcer   . Hyperlipidemia   . Hyperlipidemia   . Obesity     Surgical History: Past Surgical History:  Procedure Laterality Date   . COLONOSCOPY WITH PROPOFOL N/A 12/02/2017   Procedure: COLONOSCOPY WITH PROPOFOL;  Surgeon: Manya Silvas, MD;  Location: [facility] ENDOSCOPY;  Service: Endoscopy;  Laterality: N/A;  . ESOPHAGOGASTRODUODENOSCOPY (EGD) WITH PROPOFOL N/A 12/02/2017   Procedure: ESOPHAGOGASTRODUODENOSCOPY (EGD) WITH PROPOFOL;  Surgeon: Manya Silvas, MD;  Location: [facility] ENDOSCOPY;  Service: Endoscopy;  Laterality: N/A;  . KNEE ARTHROSCOPY Bilateral 2003, 2004  . tonsillectomty  1947  . TONSILLECTOMY    . TREATMENT FISTULA ANAL      Home Medications:  Allergies as of 11/05/2018   No Known Allergies     Medication List       Accurate as of November 05, 2018 11:59 PM. If you have any questions, ask your nurse or doctor.        STOP taking these medications   glipiZIDE 10 MG 24 hr tablet Commonly known as: GLUCOTROL XL Stopped by: Hollice Espy, MD     TAKE these medications   atorvastatin 20 MG tablet Commonly known as: LIPITOR Take 1 tablet by mouth daily.   baclofen 10 MG tablet Commonly known as: LIORESAL Take 1 tablet (10 mg total) 3 (three) times daily by mouth.   famciclovir 500 MG tablet Commonly known as: FAMVIR Take 1 tablet by mouth 2 (two) times daily as needed.   pantoprazole 40 MG tablet Commonly known as: PROTONIX Take 1 tablet by mouth daily.   sucralfate 1 g tablet Commonly known as: CARAFATE Take 1 g by mouth 4 (four) times daily.   tamsulosin 0.4  MG Caps capsule Commonly known as: FLOMAX Take 0.4 mg by mouth daily.   Trulicity 6.80 SU/1.1SR Sopn Generic drug: Dulaglutide Inject 0.75 mg into the skin once a week.       Allergies: No Known Allergies  Family History: Family History  Problem Relation Age of Onset  . Stroke Mother   . Diabetes Mother   . Heart disease Father   . Cancer Sister     Social History:  reports that he has never smoked. He has never used smokeless tobacco. He reports current alcohol use. He reports that he does not use drugs.  ROS:  UROLOGY Frequent Urination?: Yes Hard to postpone urination?: No Burning/pain with urination?: No Get up at night to urinate?: No Leakage of urine?: Yes Urine stream starts and stops?: Yes Trouble starting stream?: No Do you have to strain to urinate?: No Blood in urine?: No Urinary tract infection?: No Sexually transmitted disease?: No Injury to kidneys or bladder?: No Painful intercourse?: No Weak stream?: No Erection problems?: Yes Penile pain?: No  Gastrointestinal Nausea?: No Vomiting?: No Indigestion/heartburn?: No Diarrhea?: No Constipation?: No  Constitutional Fever: No Night sweats?: No Weight loss?: No Fatigue?: No  Skin Skin rash/lesions?: No Itching?: No  Eyes Blurred vision?: No Double vision?: No  Ears/Nose/Throat Sore throat?: No Sinus problems?: No  Hematologic/Lymphatic Swollen glands?: No Easy bruising?: No  Cardiovascular Leg swelling?: No Chest pain?: No  Respiratory Cough?: No Shortness of breath?: No  Endocrine Excessive thirst?: No  Musculoskeletal Back pain?: Yes Joint pain?: No  Neurological Headaches?: No Dizziness?: No  Psychologic Depression?: No Anxiety?: No  Physical Exam: BP (!) 149/77   Pulse 76   Ht 5' 9" (1.753 m)   Wt 244 lb (110.7 kg)   BMI 36.03 kg/m   Constitutional:  Alert and oriented, No acute distress. HEENT: [place] AT, moist mucus membranes.  Trachea midline, no masses. Cardiovascular: No clubbing, cyanosis, or edema. Respiratory: Normal respiratory effort, no increased work of breathing. GI: Abdomen is obese and protuberant.  Decompressed somewhat with catheter placement. GU: Uncircumcised phallus with thickened but easily retractable foreskin.  Mild candidal rash in groin.  Lateral descended testicles. Rectal: Slightly decreased finger tone sphincter tone.  Prostamegaly without nodules.  Normal sphincter Skin: No rashes, bruises or suspicious lesions. Neurologic: Grossly intact, no focal  deficits, moving all 4 extremities. Psychiatric: Normal mood and affect.  Laboratory Data: Labs as above Most recent creatinine 1.6 on 08/2018 which appears to be around his baseline   Pertinent Imaging: Results for orders placed or performed in visit on 11/05/18  Microscopic Examination   URINE  Result Value Ref Range   WBC, UA >30 (A) 0 - 5 /hpf   RBC None seen 0 - 2 /hpf   Epithelial Cells (non renal) 0-10 0 - 10 /hpf   Mucus, UA Present (A) Not Estab.   Bacteria, UA Few None seen/Few  Urinalysis, Complete  Result Value Ref Range   Specific Gravity, UA 1.020 1.005 - 1.030   pH, UA 5.0 5.0 - 7.5   Color, UA Yellow Yellow   Appearance Ur Clear Clear   Leukocytes,UA 1+ (A) Negative   Protein,UA Negative Negative/Trace   Glucose, UA 1+ (A) Negative   Ketones, UA Negative Negative   RBC, UA Negative Negative   Bilirubin, UA Negative Negative   Urobilinogen, Ur 0.2 0.2 - 1.0 mg/dL   Nitrite, UA Negative Negative   Microscopic Examination See below:   Bladder Scan (Post Void Residual) in office  Result Value Ref Range   Scan Result 1740m       Assessment & Plan:    1. Urinary retention Patient was prepped and draped in the standard sterile fashion.  We first attempted to place a normal catheter but met resistance of the prostate.  This was exchanged for a 176FPakistancoud with some resistance at the prostatic fossa.  Presumably, the patient is a fairly large prostate based on the small amount of catheter extruding from meatus  Suspect this is likely multifactorial including diabetic nephropathy, possibly related to his spinal stenosis and underlying BPH.  Foley catheter was placed, advised to drink plenty of water with electrolytes over the next couple of days, precautions for postobstructive diuresis were reviewed and he will monitor his output.  He will return next week for catheter removal and CIC teaching.  Given his fairly profound presentation, I would strongly  recommend urodynamics to help elucidate the role of outlet obstruction in his presentation.  This will help uKoreabetter assess whether or not an outlet procedure would be beneficial.  He is agreeable this plan.  We discussed urodynamics in detail today including the procedure itself, risks, and benefits.  All questions answered.  [facility] [place]  2. 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 BPH with urinary obstruction Enlarged prostate on exam May be contributing factor retention as above Continue Flomax Rectal exam otherwise unremarkable, low PSA without concern for prostate cancer    [facility] then f/u with me, CIC teaching next week.  AHollice Espy MD  [facility] [address] [place] [phone] I spent 45 min with this patient of which greater than 50% was spent in counseling and coordination of care with the patient.

## 2018-11-07 ENCOUNTER — Telehealth: Payer: Self-pay

## 2018-11-07 NOTE — Telephone Encounter (Signed)
Incoming call from pt with questions regarding his catheter. The patient states that there was blood in the urine, very minimal but this has since resolved. Advised pt that this is normal, he states urine is not pale yellow and clear. Pt also questions the amount of urine he is having pt notes that sometimes the volume is 848mL - 2064mL (over night) Advised pt that output is variable from patient to patient however if he has increased fluids then output would be greater. Pt states that he has significantly increased fluids, advised pt that this is the cause of increased output. Pt gave verbal understanding. Also gave pt verbal instructions on showering.

## 2018-11-10 ENCOUNTER — Telehealth: Payer: Self-pay | Admitting: Urology

## 2018-11-10 DIAGNOSIS — R32 Unspecified urinary incontinence: Secondary | ICD-10-CM

## 2018-11-10 NOTE — Addendum Note (Signed)
Addended by: Kyra Manges on: 11/10/2018 04:05 PM   Modules accepted: Orders

## 2018-11-10 NOTE — Telephone Encounter (Signed)
Pt called and states that he is having burning and aching at the cath site, he also states that he feels like he has an infection at the site. He denies any fever. Please advise.

## 2018-11-10 NOTE — Telephone Encounter (Signed)
I have a note that this patient needs UDS prior to his CIC teaching which is scheduled for the 15th? However there is not a referral for the UDS and it can't not be scheduled that quickly. Can someone please put in a referral please.  Thanks, Sharyn Lull

## 2018-11-10 NOTE — Telephone Encounter (Signed)
I need clinicals for the UDS referral so that I can fax them over please and thank you.   Sharyn Lull

## 2018-11-12 ENCOUNTER — Ambulatory Visit: Payer: Medicare Other | Admitting: Family Medicine

## 2018-11-12 ENCOUNTER — Other Ambulatory Visit: Payer: Self-pay

## 2018-11-12 DIAGNOSIS — R32 Unspecified urinary incontinence: Secondary | ICD-10-CM

## 2018-11-12 MED ORDER — CIPROFLOXACIN HCL 500 MG PO TABS: 500.0000 mg | ORAL_TABLET | Freq: Two times a day (BID) | ORAL | 0 refills | Status: DC

## 2018-11-17 ENCOUNTER — Telehealth: Payer: Self-pay

## 2018-11-17 NOTE — Telephone Encounter (Signed)
Pt presents in office for additional samples of catheters and lidocaine jelly. Patient states he is using a flex speedicath 14Fr. Pt states that he is cathing 4 times daily. Will place coloplast order today.

## 2018-11-18 ENCOUNTER — Encounter: Payer: Self-pay | Admitting: Student in an Organized Health Care Education/Training Program

## 2018-11-18 NOTE — Progress Notes (Signed)
Patient states he is in a great deal of pain.  He reports that he may have had one day of relief after the epidural. He reports lower back pain radiating into both hips, spasm like and has been getting progressively worse.  He is also having some urology issues now.  He did go and see Dr Izora Ribas PA and they want to do another MRI but blood sugars are not under control and he is working with Dr Carmie End.

## 2018-11-19 ENCOUNTER — Ambulatory Visit (INDEPENDENT_AMBULATORY_CARE_PROVIDER_SITE_OTHER): Payer: Medicare Other | Admitting: Urology

## 2018-11-19 ENCOUNTER — Ambulatory Visit
Payer: Medicare Other | Attending: Student in an Organized Health Care Education/Training Program | Admitting: Student in an Organized Health Care Education/Training Program

## 2018-11-19 ENCOUNTER — Other Ambulatory Visit: Payer: Self-pay

## 2018-11-19 ENCOUNTER — Other Ambulatory Visit: Payer: Self-pay | Admitting: Urology

## 2018-11-19 ENCOUNTER — Encounter: Payer: Self-pay | Admitting: Urology

## 2018-11-19 VITALS — BP 142/87 | HR 76 | Ht 69.0 in | Wt 236.0 lb

## 2018-11-19 DIAGNOSIS — R339 Retention of urine, unspecified: Secondary | ICD-10-CM | POA: Diagnosis not present

## 2018-11-19 DIAGNOSIS — M5441 Lumbago with sciatica, right side: Secondary | ICD-10-CM

## 2018-11-19 DIAGNOSIS — M5416 Radiculopathy, lumbar region: Secondary | ICD-10-CM | POA: Diagnosis not present

## 2018-11-19 DIAGNOSIS — G8929 Other chronic pain: Secondary | ICD-10-CM

## 2018-11-19 DIAGNOSIS — N319 Neuromuscular dysfunction of bladder, unspecified: Secondary | ICD-10-CM

## 2018-11-19 DIAGNOSIS — M48061 Spinal stenosis, lumbar region without neurogenic claudication: Secondary | ICD-10-CM | POA: Diagnosis not present

## 2018-11-19 NOTE — Progress Notes (Signed)
Pain Management Virtual Encounter Note - Virtual Visit via Telephone Telehealth (real-time audio visits between healthcare provider and patient).   Patient's Phone No. & Preferred Pharmacy:  [phone] (home); [phone] (mobile); (Preferred) [phone] [email or web address]  [id number] [address], [place] [address] Phone: [phone] Fax: [phone]    Pre-screening note:  Our staff contacted Mr. Strother and offered him an "in person", "face-to-face" appointment versus a telephone encounter. He indicated preferring the telephone encounter, at this time.   Reason for Virtual Visit: COVID-19*  Social distancing based on CDC and AMA recommendations.   I contacted Basilio Cairo on 11/19/2018 via telephone.      I clearly identified myself as Gillis Santa, MD. I verified that I was speaking with the correct person using two identifiers (Name: Larry Shaffer, and date of birth: 1940/05/16).  Advanced Informed Consent I sought verbal advanced consent from Basilio Cairo for virtual visit interactions. I informed Mr. Ohlrich of possible security and privacy concerns, risks, and limitations associated with providing "not-in-person" medical evaluation and management services. I also informed Mr. Eugene of the availability of "in-person" appointments. Finally, I informed him that there would be a charge for the virtual visit and that he could be  personally, fully or partially, financially responsible for it. Mr. Epple expressed understanding and agreed to proceed.   Historic Elements   Mr. KETIH GOODIE is a 78 y.o. year old, male patient evaluated today after his last encounter by our practice on 10/28/2018. Mr. Cobern  has a past medical history of Diabetes mellitus without complication ([place]), Gastric ulcer, Gastric ulcer, Hyperlipidemia, Hyperlipidemia, and Obesity. He also  has a past surgical history that includes Knee  arthroscopy (Bilateral, 2003, 2004); tonsillectomty (1947); Tonsillectomy; Treatment fistula anal; Esophagogastroduodenoscopy (egd) with propofol (N/A, 12/02/2017); and Colonoscopy with propofol (N/A, 12/02/2017). Mr. Sandy has a current medication list which includes the following prescription(s): atorvastatin, ciprofloxacin, trulicity, famciclovir, pantoprazole, sucralfate, tamsulosin, and baclofen. He  reports that he has never smoked. He has never used smokeless tobacco. He reports current alcohol use. He reports that he does not use drugs. Mr. Trela has No Known Allergies.   HPI  Today, he is being contacted for a post-procedure assessment.  Evaluation of last interventional procedure  10/27/2018 Procedure: L5-S1 ESI Pre-procedure pain score:  8/10 Post-procedure pain score: 5/10         Influential Factors: Intra-procedural challenges: None observed.         Reported side-effects: None.        Post-procedural adverse reactions or complications: None reported         Sedation: Please see nurses note for DOS. When no sedatives are used, the analgesic levels obtained are directly associated to the effectiveness of the local anesthetics. However, when sedation is provided, the level of analgesia obtained during the initial 1 hour following the intervention, is believed to be the result of a combination of factors. These factors may include, but are not limited to: 1. The effectiveness of the local anesthetics used. 2. The effects of the analgesic(s) and/or anxiolytic(s) used. 3. The degree of discomfort experienced by the patient at the time of the procedure. 4. The patients ability and reliability in recalling and recording the events. 5. The presence and influence of possible secondary gains and/or psychosocial factors. Reported result: Relief experienced during the 1st hour after the procedure: 100 % (Ultra-Short Term Relief)  Interpretative annotation: Clinically appropriate  result. Analgesia during this period is likely to be Local Anesthetic and/or IV Sedative (Analgesic/Anxiolytic) related.          Effects of local anesthetic: The analgesic effects attained during this period are directly associated to the localized infiltration of local anesthetics and therefore cary significant diagnostic value as to the etiological location, or anatomical origin, of the pain. Expected duration of relief is directly dependent on the pharmacodynamics of the local anesthetic used. Long-acting (4-6 hours) anesthetics used.  Reported result: Relief during the next 4 to 6 hour after the procedure: 100 % (Short-Term Relief)            Interpretative annotation: Clinically appropriate result. Analgesia during this period is likely to be Local Anesthetic-related.          Long-term benefit: Defined as the period of time past the expected duration of local anesthetics (1 hour for short-acting and 4-6 hours for long-acting). With the possible exception of prolonged sympathetic blockade from the local anesthetics, benefits during this period are typically attributed to, or associated with, other factors such as analgesic sensory neuropraxia, antiinflammatory effects, or beneficial biochemical changes provided by agents other than the local anesthetics.  Reported result: Extended relief following procedure: 100% for 3 days and then back to baseline, now having more spasms (Long-Term Relief)            Interpretative annotation: Clinically possible results. No long-term benefit. Therapeutic failure. Pain appears to be refractory to this treatment modality.          Blood sugars spiked to 300 for a week after his previous lumbar epidural steroid injection which overall was not very helpful compared to his previous epidural injections that he has had with me.   Pertinent Labs   SAFETY SCREENING Profile Lab Results  Component Value Date   SARSCOV2NAA NOT DETECTED 10/22/2018   COVIDSOURCE  NASOPHARYNGEAL 10/22/2018   Renal Function Lab Results  Component Value Date   BUN 22 06/09/2018   CREATININE 1.40 (H) 06/09/2018   GFRAA 56 (L) 06/09/2018   GFRNONAA 48 (L) 06/09/2018   Hepatic Function Lab Results  Component Value Date   AST 24 06/09/2018   ALT 29 06/09/2018   ALBUMIN 4.4 06/09/2018   UDS No results found for: SUMMARY Note: Above Lab results reviewed.  Recent imaging  CT Orbits W Contrast CLINICAL DATA:  Redness and swelling on the left with concern for orbital cellulitis.  EXAM: CT ORBITS WITH CONTRAST  TECHNIQUE: Multidetector CT images was performed according to the standard protocol following intravenous contrast administration.  CONTRAST:  72mL OMNIPAQUE IOHEXOL 300 MG/ML  SOLN  COMPARISON:  None.  FINDINGS: Orbits: Mild superficial periorbital soft tissue swelling on the left. No evidence of postseptal inflammatory change. The globe is normal. Optic nerves are normal. Extra-ocular muscles are normal. Orbital fat is normal. Lacrimal glands are normal.  Visualized sinuses: Clear  Soft tissues: Otherwise normal.  Limited intracranial: Normal for age.  IMPRESSION: Superficial periorbital cellulitis on the left. No evidence of postseptal orbital inflammation.  Electronically Signed   By: Nelson Chimes M.D.   On: 06/09/2018 14:21  Assessment  The primary encounter diagnosis was Lumbar radiculopathy. Diagnoses of Chronic radicular lumbar pain, Spinal stenosis of lumbar region without neurogenic claudication, and Acute right-sided low back pain with right-sided sciatica were also pertinent to this visit.  Patient is status post L5-S1 epidural steroid injection with me on 10/27/2018.  He states that the injection provided  him with significant pain relief for only 3 days and he was back to his baseline pain thereafter.  He is noticing more frequent spasms in his low back and continues to have low back pain with radiation into the posterior  lateral thighs.  He is having difficulty walking.  He is also having bladder incontinence which he is seeing urology for.  Patient has been evaluated by neurosurgery.  They recommended that he try and optimize his A1c and blood sugars.  His A1c was previously 9.0.  Patient states that he is working with his primary care provider to decrease his A1c.  At this point, patient is in a difficult situation as we could consider repeating epidural steroid injection however that would increase the likelihood of having elevated blood sugars which is counterproductive to his goal of trying to decrease his A1c.  Given that the previous epidural steroid injection was not very helpful, the patient wants to hold off on repeating the injection at this time.  We will place PRN order if patient would like to do that.  Plan of Care  I am having Basilio Cairo maintain his atorvastatin, famciclovir, sucralfate, pantoprazole, tamsulosin, baclofen, Trulicity, and ciprofloxacin.   Orders:  Orders Placed This Encounter  Procedures  . Lumbar Epidural Injection    Standing Status:   Standing    Number of Occurrences:   9    Standing Expiration Date:   05/21/2020    Scheduling Instructions:     Purpose: Palliative     Indication: Lower extremity pain/Sciatica unspecified side (M54.30).     Side: Midline     Level: TBD     Sedation: Patient's choice.     TIMEFRAME: PRN procedure. (Mr. Brogden will call when needed.)    Order Specific Question:   Where will this procedure be performed?    Answer:   [facility]   Follow-up plan:   Return if symptoms worsen or fail to improve.       Recent Visits Date Type Provider Dept  10/27/18 Procedure visit Gillis Santa, MD [facility]  10/06/18 Office Visit Gillis Santa, MD [facility]  Showing recent visits within past 90 days and meeting all other requirements   Today's Visits Date Type Provider Dept  11/19/18 Office Visit Gillis Santa,  MD [facility]  Showing today's visits and meeting all other requirements   Future Appointments No visits were found meeting these conditions.  Showing future appointments within next 90 days and meeting all other requirements   I discussed the assessment and treatment plan with the patient. The patient was provided an opportunity to ask questions and all were answered. The patient agreed with the plan and demonstrated an understanding of the instructions.  Patient advised to call back or seek an in-person evaluation if the symptoms or condition worsens.   Note by: Gillis Santa, MD Date: 11/19/2018; Time: 3:05 PM  Note: This dictation was prepared with Dragon dictation. Any transcriptional errors that may result from this process are unintentional.  Disclaimer:  * Given the special circumstances of the COVID-19 pandemic, the federal government has announced that the Office for Civil Rights (OCR) will exercise its enforcement discretion and will not impose penalties on physicians using telehealth in the event of noncompliance with regulatory requirements under the [place] (HIPAA) in connection with the good faith provision of telehealth during the FYBOF-75 national public health emergency. ([place])

## 2018-11-20 ENCOUNTER — Telehealth: Payer: Self-pay | Admitting: *Deleted

## 2018-11-20 LAB — PSA: Prostate Specific Ag, Serum: 1.2 ng/mL (ref 0.0–4.0)

## 2018-11-20 NOTE — Progress Notes (Signed)
11/19/2018 10:27 AM   Larry Shaffer 10-12-1940 [id number]  Referring provider: Tracie Harrier, [facility] [place],  [place] [address]  Chief Complaint  Patient presents with  . Follow-up    UDS results    HPI: 78 year old male with newly diagnosed massive urinary retention/overflow incontinence returns today following urodynamics.  In the interim, he has been able to CIC 4 times a day.  When he first caths in the morning, he often has greater than 1 L.  Throughout the day volumes range anywhere from 300 to 400 cc.  He reports that he is gotten the hang of self cathing and has no difficulties doing this.  He reports that he is no longer leaking.  She also reports that he is no longer spontaneously voiding and has no desire to void.  Urodynamics was performed at [facility] urology on 11/17/2018.  This indicated fairly significant sensory impairment with for sensation of 1600 mL's.  Max capacity was 1843 mL's.  There is loss of compliance at the end filling pressures of 15 cm of water.  He never had a desire to void.  There is no overactivity or bladder instability appreciated.  Pressure flow study was difficult to interpret as he is unable to void significant amounts (16 mL's at 2 mL's per second).  Max detrusor pressure was 30 to 34 cm of water.  PVR 1817.  Mild trabeculation was noted.  No reflux.  He reports that he continues to work on his blood sugar control.  This is a barrier to having surgical intervention for spinal issues.  His antihyperglycemic medication was just increased again.  Rectal exam performed at last visit.  No recent PSA.  PMH: Past Medical History:  Diagnosis Date  . Diabetes mellitus without complication ([place])   . Gastric ulcer   . Gastric ulcer   . Hyperlipidemia   . Hyperlipidemia   . Obesity     Surgical History: Past Surgical History:  Procedure Laterality Date  . COLONOSCOPY WITH PROPOFOL N/A 12/02/2017   Procedure: COLONOSCOPY WITH PROPOFOL;  Surgeon: Manya Silvas, MD;  Location: [facility] END[facility]COPY;  Service: Endoscopy;  Laterality: N/A;  . ESOPHAGOGASTRODUODENOSCOPY (EGD) WITH PROPOFOL N/A 12/02/2017   Procedure: ESOPHAGOGASTRODUODENOSCOPY (EGD) WITH PROPOFOL;  Surgeon: Manya Silvas, MD;  Location: [facility] ENDOSCOPY;  Service: Endoscopy;  Laterality: N/A;  . KNEE ARTHROSCOPY Bilateral 2003, 2004  . tonsillectomty  1947  . TONSILLECTOMY    . TREATMENT FISTULA ANAL      Home Medications:  Allergies as of 11/19/2018   No Known Allergies     Medication List       Accurate as of November 19, 2018 11:59 PM. If you have any questions, ask your nurse or doctor.        atorvastatin 20 MG tablet Commonly known as: LIPITOR Take 1 tablet by mouth daily.   baclofen 10 MG tablet Commonly known as: LIORESAL Take 1 tablet (10 mg total) 3 (three) times daily by mouth.   ciprofloxacin 500 MG tablet Commonly known as: CIPRO Take 1 tablet (500 mg total) by mouth every 12 (twelve) hours.   famciclovir 500 MG tablet Commonly known as: FAMVIR Take 1 tablet by mouth 2 (two) times daily as needed.   pantoprazole 40 MG tablet Commonly known as: PROTONIX Take 1 tablet by mouth daily.   sucralfate 1 g tablet Commonly known as: CARAFATE Take 1 g by mouth 4 (four) times daily.   tamsulosin 0.4 MG  Caps capsule Commonly known as: FLOMAX Take 0.4 mg by mouth daily.   Trulicity 3.22 GU/5.4YH Sopn Generic drug: Dulaglutide Inject 0.75 mg into the skin once a week.       Allergies: No Known Allergies  Family History: Family History  Problem Relation Age of Onset  . Stroke Mother   . Diabetes Mother   . Heart disease Father   . Cancer Sister     Social History:  reports that he has never smoked. He has never used smokeless tobacco. He reports current alcohol use. He reports that he does not use drugs.  ROS: UROLOGY Frequent Urination?: No Hard to postpone urination?: No Burning/pain  with urination?: No Get up at night to urinate?: No Leakage of urine?: No Urine stream starts and stops?: No Trouble starting stream?: Yes Do you have to strain to urinate?: No Blood in urine?: No Urinary tract infection?: No Sexually transmitted disease?: No Injury to kidneys or bladder?: No Painful intercourse?: No Weak stream?: No Erection problems?: No Penile pain?: No  Gastrointestinal Nausea?: No Vomiting?: No Indigestion/heartburn?: No Diarrhea?: No Constipation?: No  Constitutional Fever: No Night sweats?: No Weight loss?: No Fatigue?: No  Skin Skin rash/lesions?: No Itching?: No  Eyes Blurred vision?: No Double vision?: No  Ears/Nose/Throat Sore throat?: No Sinus problems?: No  Hematologic/Lymphatic Swollen glands?: No Easy bruising?: No  Cardiovascular Leg swelling?: No Chest pain?: No  Respiratory Cough?: No Shortness of breath?: No  Endocrine Excessive thirst?: No  Musculoskeletal Back pain?: Yes Joint pain?: No  Neurological Headaches?: No Dizziness?: No  Psychologic Depression?: No Anxiety?: No  Physical Exam: BP (!) 142/87   Pulse 76   Ht 5\' 9"  (1.753 m)   Wt 236 lb (107 kg)   BMI 34.85 kg/m   Constitutional:  Alert and oriented, No acute distress. HEENT: [place] AT, moist mucus membranes.  Trachea midline, no masses. Cardiovascular: No clubbing, cyanosis, or edema. Respiratory: Normal respiratory effort, no increased work of breathing. Skin: No rashes, bruises or suspicious lesions. Neurologic: Grossly intact, no focal deficits, moving all 4 extremities. Psychiatric: Normal mood and affect.  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  Notably, he does have decreased sphincter tone on rectal exam which goes along with his urodynamics findings.  We discussed bladder management options.  He will continue to self cath.  I like him to keep his volumes lower, no greater than 500 to 600 cc, will likely need to increase frequency of CIC to at least 5 times per day or manage fluids.  He understands this.  PSA checked today as has not yet been performed.  2. Urinary retention As above - PSA   Return in about 3 months (around 02/19/2019).  Hollice Espy, MD  [facility] [address], [place], [place] [address] [phone]  I spent 25 min with this patient of  which greater than 50% was spent in counseling and coordination of care with the patient.

## 2018-11-20 NOTE — Telephone Encounter (Addendum)
Patient informed-verbalized understanding.    ----- Message from Hollice Espy, MD sent at 11/20/2018  9:51 AM EDT ----- Awesome news, your PSA is quite low at 1.2.  Hollice Espy, MD

## 2018-11-21 ENCOUNTER — Telehealth: Payer: Self-pay | Admitting: Urology

## 2018-11-21 NOTE — Telephone Encounter (Signed)
Pt calls office asking to speak to Dr. Cherrie Gauze assistant today.  Pt states he self caths 5 times a day, he attempted to cath earlier today, wasn't able to pass the catheter, pulled out and there was blood on the catheter, attempted 2nd with a new catheter and was able to void. Pt is concerned and wants reassurance if the blood and inability to pass the catheter is normal.  Also getting up every 5 hrs getting 721mls of urine.  Please advise pt at [phone].

## 2018-11-21 NOTE — Telephone Encounter (Signed)
Spoke to patient and informed him that with the friction of CIC he may see blood from time to time and as the bladder starts to get back to normal size he may see blood. He states there were only a couple bright red drops but the urine is coming out fine. I informed him that it sounds like he is doing everything right. He verbalized understanding and will call if he has any more questions.

## 2018-11-27 ENCOUNTER — Ambulatory Visit: Payer: Medicare Other | Admitting: Student in an Organized Health Care Education/Training Program

## 2018-12-02 ENCOUNTER — Other Ambulatory Visit (HOSPITAL_COMMUNITY): Payer: Self-pay | Admitting: Student

## 2018-12-02 ENCOUNTER — Other Ambulatory Visit: Payer: Self-pay | Admitting: Student

## 2018-12-02 ENCOUNTER — Other Ambulatory Visit: Payer: Self-pay

## 2018-12-02 ENCOUNTER — Other Ambulatory Visit: Payer: Self-pay | Admitting: Urology

## 2018-12-02 DIAGNOSIS — M5416 Radiculopathy, lumbar region: Secondary | ICD-10-CM

## 2018-12-02 NOTE — Telephone Encounter (Signed)
Pt called and asked if he could have an RX for [facility] [address]. Please advise. He would also like a call back after this is done.

## 2018-12-02 NOTE — Telephone Encounter (Signed)
[employer] set up front for patients self cath.

## 2018-12-02 NOTE — Telephone Encounter (Signed)
Returned call to patient. No jelly set up front. Rx sent to Dr Erlene Quan for approval to send to pharmacy. Patient advised.

## 2018-12-04 ENCOUNTER — Other Ambulatory Visit: Payer: Self-pay | Admitting: Neurosurgery

## 2018-12-05 ENCOUNTER — Ambulatory Visit: Payer: Medicare Other | Admitting: Urology

## 2018-12-17 ENCOUNTER — Other Ambulatory Visit: Payer: Self-pay

## 2018-12-17 ENCOUNTER — Encounter
Admission: RE | Admit: 2018-12-17 | Discharge: 2018-12-17 | Disposition: A | Discharge status: Home / Self Care | Payer: Medicare Other | Source: Ambulatory Visit | Attending: Neurosurgery | Admitting: Neurosurgery

## 2018-12-17 DIAGNOSIS — Z01818 Encounter for other preprocedural examination: Secondary | ICD-10-CM | POA: Insufficient documentation

## 2018-12-17 DIAGNOSIS — I440 Atrioventricular block, first degree: Secondary | ICD-10-CM | POA: Insufficient documentation

## 2018-12-17 DIAGNOSIS — Z20828 Contact with and (suspected) exposure to other viral communicable diseases: Secondary | ICD-10-CM | POA: Diagnosis not present

## 2018-12-17 DIAGNOSIS — E119 Type 2 diabetes mellitus without complications: Secondary | ICD-10-CM | POA: Insufficient documentation

## 2018-12-17 DIAGNOSIS — R9431 Abnormal electrocardiogram [ECG] [EKG]: Secondary | ICD-10-CM | POA: Insufficient documentation

## 2018-12-17 LAB — PROTIME-INR
INR: 1.1 (ref 0.8–1.2)
Prothrombin Time: 13.6 seconds (ref 11.4–15.2)

## 2018-12-17 LAB — BASIC METABOLIC PANEL
Anion gap: 10 (ref 5–15)
BUN: 22 mg/dL (ref 8–23)
CO2: 26 mmol/L (ref 22–32)
Calcium: 9.3 mg/dL (ref 8.9–10.3)
Chloride: 102 mmol/L (ref 98–111)
Creatinine, Ser: 1.4 mg/dL — ABNORMAL HIGH (ref 0.61–1.24)
GFR calc Af Amer: 56 mL/min — ABNORMAL LOW (ref >60)
GFR calc non Af Amer: 48 mL/min — ABNORMAL LOW (ref >60)
Glucose, Bld: 137 mg/dL — ABNORMAL HIGH (ref 70–99)
Potassium: 4.6 mmol/L (ref 3.5–5.1)
Sodium: 138 mmol/L (ref 135–145)

## 2018-12-17 LAB — CBC
HCT: 44.4 % (ref 39.0–52.0)
Hemoglobin: 15.2 g/dL (ref 13.0–17.0)
MCH: 33.2 pg (ref 26.0–34.0)
MCHC: 34.2 g/dL (ref 30.0–36.0)
MCV: 96.9 fL (ref 80.0–100.0)
Platelets: 283 10*3/uL (ref 150–400)
RBC: 4.58 MIL/uL (ref 4.22–5.81)
RDW: 11.7 % (ref 11.5–15.5)
WBC: 9 10*3/uL (ref 4.0–10.5)
nRBC: 0 % (ref 0.0–0.2)

## 2018-12-17 LAB — SURGICAL PCR SCREEN
MRSA, PCR: NEGATIVE
Staphylococcus aureus: NEGATIVE

## 2018-12-17 LAB — TYPE AND SCREEN
ABO/RH(D): O POS
Antibody Screen: NEGATIVE

## 2018-12-17 LAB — APTT: aPTT: 28 seconds (ref 24–36)

## 2018-12-17 NOTE — Pre-Procedure Instructions (Signed)
EKG reviewed by DR. Rosey Bath. No new orders.

## 2018-12-17 NOTE — Patient Instructions (Signed)
Your procedure is scheduled on: Wed 12/24/18 Report to [place]. To find out your arrival time please call [phone] between 1PM - 3PM on Tue 12/23/18.  Remember: Instructions that are not followed completely may result in serious medical risk, up to and including death, or upon the discretion of your surgeon and anesthesiologist your surgery may need to be rescheduled.     _X__ 1. Do not eat food after midnight the night before your procedure.                 No gum chewing or hard candies. You may drink clear liquids up to 2 hours                 before you are scheduled to arrive for your surgery- DO not drink clear                 liquids within 2 hours of the start of your surgery.                 Clear Liquids include:  water, apple juice without pulp, clear carbohydrate                 drink such as Clearfast or Gatorade, Black Coffee or Tea (Do not add                 anything to coffee or tea). Diabetics water only  __X__2.  On the morning of surgery brush your teeth with toothpaste and water, you                 may rinse your mouth with mouthwash if you wish.  Do not swallow any              toothpaste of mouthwash.     _X__ 3.  No Alcohol for 24 hours before or after surgery.   _X__ 4.  Do Not Smoke or use e-cigarettes For 24 Hours Prior to Your Surgery.                 Do not use any chewable tobacco products for at least 6 hours prior to                 surgery.  ____  5.  Bring all medications with you on the day of surgery if instructed.   __X__  6.  Notify your doctor if there is any change in your medical condition      (cold, fever, infections).     Do not wear jewelry, make-up, hairpins, clips or nail polish. Do not wear lotions, powders, or perfumes.  Do not shave 48 hours prior to surgery. Men may shave face and neck. Do not bring valuables to the hospital.    [facility] is not responsible for any  belongings or valuables.  Contacts, dentures/partials or body piercings may not be worn into surgery. Bring a case for your contacts, glasses or hearing aids, a denture cup will be supplied. Leave your suitcase in the car. After surgery it may be brought to your room. For patients admitted to the hospital, discharge time is determined by your treatment team.   Patients discharged the day of surgery will not be allowed to drive home.   Please read over the following fact sheets that you were given:   MRSA Information  __X__ Take these medicines the morning of surgery with A SIP OF WATER:  1. atorvastatin (LIPITOR  2. pantoprazole (PROTONIX  3. tamsulosin (FLOMAX)  4.  5.  6.  ____ Fleet Enema (as directed)   __X__ Use CHG Soap/SAGE wipes as directed  ____ Use inhalers on the day of surgery  ____ Stop metformin/Janumet/Farxiga 2 days prior to surgery    ____ Take 1/2 of usual insulin dose the night before surgery. No insulin the morning          of surgery.   ____ Stop Blood Thinners Coumadin/Plavix/Xarelto/Pleta/Pradaxa/Eliquis/Effient/Aspirin  on   Or contact your Surgeon, Cardiologist or Medical Doctor regarding  ability to stop your blood thinners  __X__ Stop Anti-inflammatories 7 days before surgery such as Advil, Ibuprofen, Motrin,  BC or Goodies Powder, Naprosyn, Naproxen, Aleve, Aspirin    __X__ Stop all herbal supplements, fish oil or vitamin E until after surgery.    ____ Bring C-Pap to the hospital.

## 2018-12-19 ENCOUNTER — Other Ambulatory Visit
Admission: RE | Admit: 2018-12-19 | Discharge: 2018-12-19 | Disposition: A | Discharge status: Home / Self Care | Payer: Medicare Other | Source: Ambulatory Visit | Attending: Neurosurgery | Admitting: Neurosurgery

## 2018-12-19 ENCOUNTER — Other Ambulatory Visit: Payer: Self-pay

## 2018-12-19 DIAGNOSIS — Z01818 Encounter for other preprocedural examination: Secondary | ICD-10-CM | POA: Diagnosis not present

## 2018-12-19 LAB — SARS CORONAVIRUS 2 (TAT 6-24 HRS): SARS Coronavirus 2: NEGATIVE

## 2018-12-19 LAB — URINALYSIS, ROUTINE W REFLEX MICROSCOPIC
Bilirubin Urine: NEGATIVE
Glucose, UA: NEGATIVE mg/dL
Hgb urine dipstick: NEGATIVE
Ketones, ur: NEGATIVE mg/dL
Leukocytes,Ua: NEGATIVE
Nitrite: NEGATIVE
Protein, ur: NEGATIVE mg/dL
Specific Gravity, Urine: 1.013 (ref 1.005–1.030)
pH: 6 (ref 5.0–8.0)

## 2018-12-24 ENCOUNTER — Other Ambulatory Visit: Payer: Self-pay

## 2018-12-24 ENCOUNTER — Ambulatory Visit: Payer: Medicare Other

## 2018-12-24 ENCOUNTER — Encounter
Admission: RE | Disposition: A | Discharge status: Home / Self Care | Payer: Self-pay | Source: Home / Self Care | Attending: Neurosurgery

## 2018-12-24 ENCOUNTER — Observation Stay
Admission: RE | Admit: 2018-12-24 | Discharge: 2018-12-25 | Disposition: A | Discharge status: Home / Self Care | Payer: Medicare Other | Attending: Neurosurgery | Admitting: Neurosurgery

## 2018-12-24 ENCOUNTER — Ambulatory Visit: Payer: Medicare Other | Admitting: Anesthesiology

## 2018-12-24 ENCOUNTER — Encounter: Payer: Self-pay | Admitting: Certified Registered Nurse Anesthetist

## 2018-12-24 DIAGNOSIS — N400 Enlarged prostate without lower urinary tract symptoms: Secondary | ICD-10-CM | POA: Insufficient documentation

## 2018-12-24 DIAGNOSIS — Z419 Encounter for procedure for purposes other than remedying health state, unspecified: Secondary | ICD-10-CM

## 2018-12-24 DIAGNOSIS — Z6835 Body mass index (BMI) 35.0-35.9, adult: Secondary | ICD-10-CM | POA: Insufficient documentation

## 2018-12-24 DIAGNOSIS — E119 Type 2 diabetes mellitus without complications: Secondary | ICD-10-CM | POA: Diagnosis not present

## 2018-12-24 DIAGNOSIS — M5106 Intervertebral disc disorders with myelopathy, lumbar region: Secondary | ICD-10-CM | POA: Insufficient documentation

## 2018-12-24 DIAGNOSIS — M5116 Intervertebral disc disorders with radiculopathy, lumbar region: Secondary | ICD-10-CM | POA: Diagnosis present

## 2018-12-24 DIAGNOSIS — K219 Gastro-esophageal reflux disease without esophagitis: Secondary | ICD-10-CM | POA: Diagnosis not present

## 2018-12-24 DIAGNOSIS — Z7984 Long term (current) use of oral hypoglycemic drugs: Secondary | ICD-10-CM | POA: Diagnosis not present

## 2018-12-24 DIAGNOSIS — E785 Hyperlipidemia, unspecified: Secondary | ICD-10-CM | POA: Insufficient documentation

## 2018-12-24 DIAGNOSIS — Z79899 Other long term (current) drug therapy: Secondary | ICD-10-CM | POA: Insufficient documentation

## 2018-12-24 DIAGNOSIS — E669 Obesity, unspecified: Secondary | ICD-10-CM | POA: Insufficient documentation

## 2018-12-24 DIAGNOSIS — G834 Cauda equina syndrome: Secondary | ICD-10-CM | POA: Diagnosis not present

## 2018-12-24 DIAGNOSIS — Z9889 Other specified postprocedural states: Secondary | ICD-10-CM

## 2018-12-24 HISTORY — PX: LUMBAR LAMINECTOMY/DECOMPRESSION MICRODISCECTOMY: SHX5026

## 2018-12-24 LAB — GLUCOSE, CAPILLARY
Glucose-Capillary: 170 mg/dL — ABNORMAL HIGH (ref 70–99)
Glucose-Capillary: 210 mg/dL — ABNORMAL HIGH (ref 70–99)
Glucose-Capillary: 274 mg/dL — ABNORMAL HIGH (ref 70–99)
Glucose-Capillary: 291 mg/dL — ABNORMAL HIGH (ref 70–99)

## 2018-12-24 LAB — HEMOGLOBIN A1C
Hgb A1c MFr Bld: 7.7 % — ABNORMAL HIGH (ref 4.8–5.6)
Mean Plasma Glucose: 174.29 mg/dL

## 2018-12-24 LAB — ABO/RH: ABO/RH(D): O POS

## 2018-12-24 SURGERY — LUMBAR LAMINECTOMY/DECOMPRESSION MICRODISCECTOMY 1 LEVEL
Anesthesia: General | Laterality: Left

## 2018-12-24 MED ORDER — PANTOPRAZOLE SODIUM 40 MG PO TBEC
40.0000 mg | DELAYED_RELEASE_TABLET | Freq: Every day | ORAL | Status: DC
  Administered 2018-12-24 – 2018-12-25 (×2): 40 mg via ORAL
  Filled 2018-12-24 (×2): qty 1

## 2018-12-24 MED ORDER — SUCCINYLCHOLINE CHLORIDE 20 MG/ML IJ SOLN
INTRAMUSCULAR | Status: DC | PRN
  Administered 2018-12-24: 100 mg via INTRAVENOUS

## 2018-12-24 MED ORDER — KETAMINE HCL 50 MG/ML IJ SOLN
INTRAMUSCULAR | Status: DC | PRN
  Administered 2018-12-24: 50 mg via INTRAMUSCULAR

## 2018-12-24 MED ORDER — SODIUM CHLORIDE 0.9% FLUSH: 3.0000 mL | INTRAVENOUS | Status: DC | PRN

## 2018-12-24 MED ORDER — CEFAZOLIN SODIUM-DEXTROSE 2-4 GM/100ML-% IV SOLN
2.0000 g | Freq: Once | INTRAVENOUS | Status: AC
  Administered 2018-12-24: 11:00:00 2 g via INTRAVENOUS

## 2018-12-24 MED ORDER — FENTANYL CITRATE (PF) 100 MCG/2ML IJ SOLN
INTRAMUSCULAR | Status: DC | PRN
  Administered 2018-12-24 (×2): 100 ug via INTRAVENOUS
  Administered 2018-12-24: 150 ug via INTRAVENOUS

## 2018-12-24 MED ORDER — POLYETHYLENE GLYCOL 3350 17 G PO PACK
17.0000 g | PACK | Freq: Every day | ORAL | Status: DC | PRN
  Filled 2018-12-24: qty 1

## 2018-12-24 MED ORDER — ONDANSETRON HCL 4 MG/2ML IJ SOLN
INTRAMUSCULAR | Status: AC
  Filled 2018-12-24: qty 2

## 2018-12-24 MED ORDER — SENNA 8.6 MG PO TABS
1.0000 | ORAL_TABLET | Freq: Two times a day (BID) | ORAL | Status: DC
  Administered 2018-12-24 – 2018-12-25 (×2): 8.6 mg via ORAL
  Filled 2018-12-24 (×2): qty 1

## 2018-12-24 MED ORDER — ACETAMINOPHEN 10 MG/ML IV SOLN
INTRAVENOUS | Status: DC | PRN
  Administered 2018-12-24: 1000 mg via INTRAVENOUS

## 2018-12-24 MED ORDER — MIDAZOLAM HCL 2 MG/2ML IJ SOLN
INTRAMUSCULAR | Status: AC
  Filled 2018-12-24: qty 2

## 2018-12-24 MED ORDER — OXYCODONE HCL 5 MG PO TABS
5.0000 mg | ORAL_TABLET | ORAL | Status: DC | PRN
  Administered 2018-12-24: 22:00:00 5 mg via ORAL
  Filled 2018-12-24: qty 1

## 2018-12-24 MED ORDER — SUCCINYLCHOLINE CHLORIDE 20 MG/ML IJ SOLN
INTRAMUSCULAR | Status: AC
  Filled 2018-12-24: qty 1

## 2018-12-24 MED ORDER — DEXAMETHASONE SODIUM PHOSPHATE 10 MG/ML IJ SOLN
INTRAMUSCULAR | Status: DC | PRN
  Administered 2018-12-24: 10 mg via INTRAVENOUS

## 2018-12-24 MED ORDER — METHOCARBAMOL 500 MG PO TABS
500.0000 mg | ORAL_TABLET | Freq: Four times a day (QID) | ORAL | Status: DC
  Administered 2018-12-24 – 2018-12-25 (×4): 500 mg via ORAL
  Filled 2018-12-24 (×4): qty 1

## 2018-12-24 MED ORDER — BUPIVACAINE LIPOSOME 1.3 % IJ SUSP
INTRAMUSCULAR | Status: AC
  Filled 2018-12-24: qty 20

## 2018-12-24 MED ORDER — BUPIVACAINE HCL (PF) 0.5 % IJ SOLN
INTRAMUSCULAR | Status: AC
  Filled 2018-12-24: qty 30

## 2018-12-24 MED ORDER — SODIUM CHLORIDE 0.9 % IV SOLN
INTRAVENOUS | Status: DC
  Administered 2018-12-24 (×2): via INTRAVENOUS

## 2018-12-24 MED ORDER — BISACODYL 5 MG PO TBEC: 5.0000 mg | DELAYED_RELEASE_TABLET | Freq: Every day | ORAL | Status: DC | PRN

## 2018-12-24 MED ORDER — ACETAMINOPHEN 10 MG/ML IV SOLN
INTRAVENOUS | Status: AC
  Filled 2018-12-24: qty 100

## 2018-12-24 MED ORDER — TAMSULOSIN HCL 0.4 MG PO CAPS
0.4000 mg | ORAL_CAPSULE | Freq: Every day | ORAL | Status: DC
  Administered 2018-12-24 – 2018-12-25 (×2): 0.4 mg via ORAL
  Filled 2018-12-24 (×2): qty 1

## 2018-12-24 MED ORDER — MAGNESIUM CITRATE PO SOLN
1.0000 | Freq: Once | ORAL | Status: DC | PRN
  Filled 2018-12-24: qty 296

## 2018-12-24 MED ORDER — METHYLPREDNISOLONE ACETATE 40 MG/ML IJ SUSP
INTRAMUSCULAR | Status: AC
  Filled 2018-12-24: qty 1

## 2018-12-24 MED ORDER — ACETAMINOPHEN 325 MG PO TABS: 650.0000 mg | ORAL_TABLET | ORAL | Status: DC | PRN

## 2018-12-24 MED ORDER — SODIUM CHLORIDE 0.9 % IV SOLN: 250.0000 mL | INTRAVENOUS | Status: DC

## 2018-12-24 MED ORDER — INSULIN ASPART 100 UNIT/ML ~~LOC~~ SOLN
0.0000 [IU] | Freq: Three times a day (TID) | SUBCUTANEOUS | Status: DC
  Administered 2018-12-24: 17:00:00 8 [IU] via SUBCUTANEOUS
  Administered 2018-12-25: 09:00:00 3 [IU] via SUBCUTANEOUS
  Administered 2018-12-25: 5 [IU] via SUBCUTANEOUS
  Filled 2018-12-24 (×3): qty 1

## 2018-12-24 MED ORDER — CEFAZOLIN SODIUM-DEXTROSE 2-4 GM/100ML-% IV SOLN
INTRAVENOUS | Status: AC
  Filled 2018-12-24: qty 100

## 2018-12-24 MED ORDER — LIDOCAINE HCL (CARDIAC) PF 100 MG/5ML IV SOSY
PREFILLED_SYRINGE | INTRAVENOUS | Status: DC | PRN
  Administered 2018-12-24: 100 mg via INTRAVENOUS

## 2018-12-24 MED ORDER — EPHEDRINE SULFATE 50 MG/ML IJ SOLN
INTRAMUSCULAR | Status: AC
  Filled 2018-12-24: qty 1

## 2018-12-24 MED ORDER — FENTANYL CITRATE (PF) 100 MCG/2ML IJ SOLN
INTRAMUSCULAR | Status: AC
  Filled 2018-12-24: qty 2

## 2018-12-24 MED ORDER — FENTANYL CITRATE (PF) 100 MCG/2ML IJ SOLN: 25.0000 ug | INTRAMUSCULAR | Status: DC | PRN

## 2018-12-24 MED ORDER — SODIUM CHLORIDE 0.9 % IV SOLN: INTRAVENOUS | Status: DC

## 2018-12-24 MED ORDER — ATORVASTATIN CALCIUM 20 MG PO TABS
20.0000 mg | ORAL_TABLET | Freq: Every day | ORAL | Status: DC
  Administered 2018-12-25: 20 mg via ORAL
  Filled 2018-12-24: qty 1

## 2018-12-24 MED ORDER — ONDANSETRON HCL 4 MG/2ML IJ SOLN: 4.0000 mg | Freq: Once | INTRAMUSCULAR | Status: DC | PRN

## 2018-12-24 MED ORDER — SODIUM CHLORIDE 0.9 % IR SOLN
Status: DC | PRN
  Administered 2018-12-24: 1000 mL

## 2018-12-24 MED ORDER — ACETAMINOPHEN 500 MG PO TABS
1000.0000 mg | ORAL_TABLET | Freq: Four times a day (QID) | ORAL | Status: AC
  Administered 2018-12-24 – 2018-12-25 (×4): 1000 mg via ORAL
  Filled 2018-12-24 (×6): qty 2

## 2018-12-24 MED ORDER — FAMOTIDINE 20 MG PO TABS
ORAL_TABLET | ORAL | Status: AC
  Administered 2018-12-24: 10:00:00 20 mg via ORAL
  Filled 2018-12-24: qty 1

## 2018-12-24 MED ORDER — SODIUM CHLORIDE 0.9% FLUSH
3.0000 mL | Freq: Two times a day (BID) | INTRAVENOUS | Status: DC
  Administered 2018-12-24 – 2018-12-25 (×2): 3 mL via INTRAVENOUS

## 2018-12-24 MED ORDER — MIDAZOLAM HCL 2 MG/2ML IJ SOLN
INTRAMUSCULAR | Status: DC | PRN
  Administered 2018-12-24 (×2): 1 mg via INTRAVENOUS

## 2018-12-24 MED ORDER — METHYLPREDNISOLONE ACETATE 40 MG/ML IJ SUSP
INTRAMUSCULAR | Status: DC | PRN
  Administered 2018-12-24: 40 mg

## 2018-12-24 MED ORDER — BUPIVACAINE HCL 0.5 % IJ SOLN
INTRAMUSCULAR | Status: DC | PRN
  Administered 2018-12-24: 20 mL

## 2018-12-24 MED ORDER — BUPIVACAINE-EPINEPHRINE (PF) 0.5% -1:200000 IJ SOLN
INTRAMUSCULAR | Status: AC
  Filled 2018-12-24: qty 30

## 2018-12-24 MED ORDER — SODIUM CHLORIDE (PF) 0.9 % IJ SOLN
INTRAMUSCULAR | Status: AC
  Filled 2018-12-24: qty 50

## 2018-12-24 MED ORDER — BACITRACIN 50000 UNITS IM SOLR
INTRAMUSCULAR | Status: AC
  Filled 2018-12-24: qty 1

## 2018-12-24 MED ORDER — SUCRALFATE 1 G PO TABS
1.0000 g | ORAL_TABLET | Freq: Two times a day (BID) | ORAL | Status: DC
  Administered 2018-12-24 – 2018-12-25 (×2): 1 g via ORAL
  Filled 2018-12-24 (×2): qty 1

## 2018-12-24 MED ORDER — GLIPIZIDE ER 10 MG PO TB24
10.0000 mg | ORAL_TABLET | Freq: Two times a day (BID) | ORAL | Status: DC
  Administered 2018-12-24 – 2018-12-25 (×2): 10 mg via ORAL
  Filled 2018-12-24 (×3): qty 1

## 2018-12-24 MED ORDER — LIDOCAINE HCL 4 % MT SOLN
OROMUCOSAL | Status: DC | PRN
  Administered 2018-12-24: 4 mL via TOPICAL

## 2018-12-24 MED ORDER — THROMBIN 5000 UNITS EX SOLR
CUTANEOUS | Status: AC
  Filled 2018-12-24: qty 5000

## 2018-12-24 MED ORDER — PHENOL 1.4 % MT LIQD
1.0000 | OROMUCOSAL | Status: DC | PRN
  Filled 2018-12-24: qty 177

## 2018-12-24 MED ORDER — DEXAMETHASONE SODIUM PHOSPHATE 10 MG/ML IJ SOLN
INTRAMUSCULAR | Status: AC
  Filled 2018-12-24: qty 1

## 2018-12-24 MED ORDER — ONDANSETRON HCL 4 MG PO TABS: 4.0000 mg | ORAL_TABLET | Freq: Four times a day (QID) | ORAL | Status: DC | PRN

## 2018-12-24 MED ORDER — ROCURONIUM BROMIDE 50 MG/5ML IV SOLN
INTRAVENOUS | Status: AC
  Filled 2018-12-24: qty 1

## 2018-12-24 MED ORDER — ONDANSETRON HCL 4 MG/2ML IJ SOLN: 4.0000 mg | Freq: Four times a day (QID) | INTRAMUSCULAR | Status: DC | PRN

## 2018-12-24 MED ORDER — OXYCODONE HCL 5 MG PO TABS: 10.0000 mg | ORAL_TABLET | ORAL | Status: DC | PRN

## 2018-12-24 MED ORDER — THROMBIN 5000 UNITS EX SOLR
CUTANEOUS | Status: DC | PRN
  Administered 2018-12-24: 5000 [IU] via TOPICAL

## 2018-12-24 MED ORDER — EPHEDRINE SULFATE 50 MG/ML IJ SOLN
INTRAMUSCULAR | Status: DC | PRN
  Administered 2018-12-24: 15 mg via INTRAVENOUS
  Administered 2018-12-24: 10 mg via INTRAVENOUS

## 2018-12-24 MED ORDER — SODIUM CHLORIDE 0.9 % IV SOLN
INTRAVENOUS | Status: DC | PRN
  Administered 2018-12-24: 40 mL

## 2018-12-24 MED ORDER — FAMOTIDINE 20 MG PO TABS
20.0000 mg | ORAL_TABLET | Freq: Once | ORAL | Status: AC
  Administered 2018-12-24: 10:00:00 20 mg via ORAL

## 2018-12-24 MED ORDER — PHENYLEPHRINE HCL (PRESSORS) 10 MG/ML IV SOLN
INTRAVENOUS | Status: DC | PRN
  Administered 2018-12-24 (×3): 100 ug via INTRAVENOUS

## 2018-12-24 MED ORDER — FENTANYL CITRATE (PF) 250 MCG/5ML IJ SOLN
INTRAMUSCULAR | Status: AC
  Filled 2018-12-24: qty 5

## 2018-12-24 MED ORDER — SODIUM CHLORIDE 0.9 % IV SOLN
INTRAVENOUS | Status: DC | PRN
  Administered 2018-12-24: 11:00:00 40 ug/min via INTRAVENOUS

## 2018-12-24 MED ORDER — PHENYLEPHRINE HCL (PRESSORS) 10 MG/ML IV SOLN
INTRAVENOUS | Status: AC
  Filled 2018-12-24: qty 1

## 2018-12-24 MED ORDER — PROPOFOL 10 MG/ML IV BOLUS
INTRAVENOUS | Status: AC
  Filled 2018-12-24: qty 20

## 2018-12-24 MED ORDER — ACETAMINOPHEN 650 MG RE SUPP: 650.0000 mg | RECTAL | Status: DC | PRN

## 2018-12-24 MED ORDER — HYDROMORPHONE HCL 1 MG/ML IJ SOLN: 0.5000 mg | INTRAMUSCULAR | Status: DC | PRN

## 2018-12-24 MED ORDER — LIDOCAINE HCL (PF) 2 % IJ SOLN
INTRAMUSCULAR | Status: AC
  Filled 2018-12-24: qty 10

## 2018-12-24 MED ORDER — ROCURONIUM BROMIDE 100 MG/10ML IV SOLN
INTRAVENOUS | Status: DC | PRN
  Administered 2018-12-24: 5 mg via INTRAVENOUS

## 2018-12-24 MED ORDER — METHOCARBAMOL 1000 MG/10ML IJ SOLN
500.0000 mg | Freq: Four times a day (QID) | INTRAVENOUS | Status: DC
  Filled 2018-12-24: qty 5

## 2018-12-24 MED ORDER — MENTHOL 3 MG MT LOZG
1.0000 | LOZENGE | OROMUCOSAL | Status: DC | PRN
  Filled 2018-12-24: qty 9

## 2018-12-24 MED ORDER — ONDANSETRON HCL 4 MG/2ML IJ SOLN
INTRAMUSCULAR | Status: DC | PRN
  Administered 2018-12-24: 4 mg via INTRAVENOUS

## 2018-12-24 MED ORDER — PROPOFOL 10 MG/ML IV BOLUS
INTRAVENOUS | Status: DC | PRN
  Administered 2018-12-24: 200 mg via INTRAVENOUS

## 2018-12-24 SURGICAL SUPPLY — 61 items
BUR NEURO DRILL SOFT 3.0X3.8M (BURR) ×3 IMPLANT
CANISTER SUCT 1200ML W/VALVE (MISCELLANEOUS) ×6 IMPLANT
CATH COUDE FOLEY 2W 5CC 16FR (CATHETERS) ×3 IMPLANT
CATH COUDE FOLEY 5CC 14FR (CATHETERS) IMPLANT
CHLORAPREP W/TINT 26 (MISCELLANEOUS) ×6 IMPLANT
CNTNR SPEC 2.5X3XGRAD LEK (MISCELLANEOUS) ×1
CONT SPEC 4OZ STER OR WHT (MISCELLANEOUS) ×2
CONTAINER SPEC 2.5X3XGRAD LEK (MISCELLANEOUS) ×1 IMPLANT
COUNTER NEEDLE 20/40 LG (NEEDLE) ×3 IMPLANT
COVER LIGHT HANDLE STERIS (MISCELLANEOUS) ×6 IMPLANT
COVER WAND RF STERILE (DRAPES) ×3 IMPLANT
CUP MEDICINE 2OZ PLAST GRAD ST (MISCELLANEOUS) ×6 IMPLANT
DERMABOND ADVANCED (GAUZE/BANDAGES/DRESSINGS) ×2
DERMABOND ADVANCED .7 DNX12 (GAUZE/BANDAGES/DRESSINGS) ×1 IMPLANT
DRAPE C-ARM 42X72 X-RAY (DRAPES) ×12 IMPLANT
DRAPE LAPAROTOMY 100X77 ABD (DRAPES) ×3 IMPLANT
DRAPE MICROSCOPE SPINE 48X150 (DRAPES) ×3 IMPLANT
DRAPE POUCH INSTRU U-SHP 10X18 (DRAPES) ×3 IMPLANT
DRAPE SURG 17X11 SM STRL (DRAPES) ×12 IMPLANT
ELECT CAUTERY BLADE TIP 2.5 (TIP) ×3
ELECT EZSTD 165MM 6.5IN (MISCELLANEOUS)
ELECT REM PT RETURN 9FT ADLT (ELECTROSURGICAL) ×3
ELECTRODE CAUTERY BLDE TIP 2.5 (TIP) ×1 IMPLANT
ELECTRODE EZSTD 165MM 6.5IN (MISCELLANEOUS) IMPLANT
ELECTRODE REM PT RTRN 9FT ADLT (ELECTROSURGICAL) ×1 IMPLANT
FRAME EYE SHIELD (PROTECTIVE WEAR) ×6 IMPLANT
GLOVE BIOGEL PI IND STRL 7.0 (GLOVE) ×1 IMPLANT
GLOVE BIOGEL PI INDICATOR 7.0 (GLOVE) ×2
GLOVE SURG SYN 7.0 (GLOVE) ×6 IMPLANT
GLOVE SURG SYN 8.5  E (GLOVE) ×6
GLOVE SURG SYN 8.5 E (GLOVE) ×3 IMPLANT
GOWN SRG XL LVL 3 NONREINFORCE (GOWNS) ×1 IMPLANT
GOWN STRL NON-REIN TWL XL LVL3 (GOWNS) ×2
GOWN STRL REUS W/TWL MED LVL3 (GOWN DISPOSABLE) ×3 IMPLANT
GRADUATE 1200CC STRL 31836 (MISCELLANEOUS) ×3 IMPLANT
HOLDER FOLEY CATH W/STRAP (MISCELLANEOUS) ×3 IMPLANT
KIT SPINAL PRONEVIEW (KITS) ×3 IMPLANT
KNIFE BAYONET SHORT DISCETOMY (MISCELLANEOUS) IMPLANT
MARKER SKIN DUAL TIP RULER LAB (MISCELLANEOUS) ×3 IMPLANT
NDL SAFETY ECLIPSE 18X1.5 (NEEDLE) ×1 IMPLANT
NEEDLE HYPO 18GX1.5 SHARP (NEEDLE) ×2
NEEDLE HYPO 22GX1.5 SAFETY (NEEDLE) ×3 IMPLANT
NS IRRIG 1000ML POUR BTL (IV SOLUTION) ×3 IMPLANT
PACK LAMINECTOMY NEURO (CUSTOM PROCEDURE TRAY) ×3 IMPLANT
PAD ARMBOARD 7.5X6 YLW CONV (MISCELLANEOUS) ×3 IMPLANT
SPOGE SURGIFLO 8M (HEMOSTASIS) ×2
SPONGE SURGIFLO 8M (HEMOSTASIS) ×1 IMPLANT
SUT DVC VLOC 3-0 CL 6 P-12 (SUTURE) ×3 IMPLANT
SUT VIC AB 0 CT1 27 (SUTURE) ×2
SUT VIC AB 0 CT1 27XCR 8 STRN (SUTURE) ×1 IMPLANT
SUT VIC AB 2-0 CT1 18 (SUTURE) ×3 IMPLANT
SYR 10ML LL (SYRINGE) ×3 IMPLANT
SYR 20ML LL LF (SYRINGE) ×3 IMPLANT
SYR 30ML LL (SYRINGE) ×6 IMPLANT
SYR 3ML LL SCALE MARK (SYRINGE) ×3 IMPLANT
TOWEL OR 17X26 4PK STRL BLUE (TOWEL DISPOSABLE) ×9 IMPLANT
TRAY FOLEY SLVR 16FR LF STAT (SET/KITS/TRAYS/PACK) ×3 IMPLANT
TUBE MATRX SPINL 18MM 6CM DISP (INSTRUMENTS) ×2
TUBE METRX SPINAL 18X6 DISP (INSTRUMENTS) ×1 IMPLANT
TUBING CONNECTING 10 (TUBING) ×2 IMPLANT
TUBING CONNECTING 10' (TUBING) ×1

## 2018-12-24 NOTE — Anesthesia Preprocedure Evaluation (Signed)
Anesthesia Evaluation  Patient identified by MRN, date of birth, ID band Patient awake    Reviewed: Allergy & Precautions, NPO status , Patient's Chart, lab work & pertinent test results  History of Anesthesia Complications Negative for: history of anesthetic complications  Airway Mallampati: III       Dental   Pulmonary neg sleep apnea, neg COPD, Not current smoker,           Cardiovascular (-) hypertension(-) Past MI and (-) CHF (-) dysrhythmias (-) Valvular Problems/Murmurs     Neuro/Psych neg Seizures    GI/Hepatic Neg liver ROS, PUD, GERD  Medicated and Controlled,  Endo/Other  diabetes, Type 2, Oral Hypoglycemic Agents  Renal/GU negative Renal ROS     Musculoskeletal   Abdominal   Peds  Hematology   Anesthesia Other Findings   Reproductive/Obstetrics                             Anesthesia Physical Anesthesia Plan  ASA: II  Anesthesia Plan: General   Post-op Pain Management:    Induction: Intravenous  PONV Risk Score and Plan: 2 and Dexamethasone and Ondansetron  Airway Management Planned: Oral ETT  Additional Equipment:   Intra-op Plan:   Post-operative Plan:   Informed Consent: I have reviewed the patients History and Physical, chart, labs and discussed the procedure including the risks, benefits and alternatives for the proposed anesthesia with the patient or authorized representative who has indicated his/her understanding and acceptance.       Plan Discussed with:   Anesthesia Plan Comments:         Anesthesia Quick Evaluation

## 2018-12-24 NOTE — Op Note (Signed)
Indications: Mr. Strauss is a 78 yo male who presented with lumbar radiculopathy and cauda equina syndrome.  Due to his cauda equina syndrome and progressive symptoms, he elected for surgical intervention.  Findings: large L4-5 disc herniation  Preoperative Diagnosis: see above Postoperative Diagnosis: same   EBL: 250 ml IVF: 1000 ml Drains: none Disposition: Extubated and Stable to PACU Complications: none  A foley catheter was placed.   Preoperative Note:   Risks of surgery discussed include: infection, bleeding, stroke, coma, death, paralysis, CSF leak, nerve/spinal cord injury, numbness, tingling, weakness, complex regional pain syndrome, recurrent stenosis and/or disc herniation, vascular injury, development of instability, neck/back pain, need for further surgery, persistent symptoms, development of deformity, and the risks of anesthesia. The patient understood these risks and agreed to proceed.  Operative Note:   1. L5-S1 lumbar decompression including central laminectomy and bilateral medial facetectomies including foraminotomies 2. Left L4-5 microdiscectomy  The patient was then brought from the preoperative center with intravenous access established.  The patient underwent general anesthesia and endotracheal tube intubation, and was then rotated on the [place] rail top where all pressure points were appropriately padded.  The skin was then thoroughly cleansed.  Perioperative antibiotic prophylaxis was administered.  Sterile prep and drapes were then applied and a timeout was then observed.  C-arm was brought into the field under sterile conditions and under lateral visualization the L5-S1 interspace was identified and marked.  The incision was marked on the left and injected with local anesthetic. Once this was complete a 4 cm incision was opened with the use of a #10 blade knife.    The metrx tubes were sequentially advanced and confirmed in position at L5-S1. An 28mm by 67mm  tube was locked in place to the bed side attachment.  The microscope was then sterilely brought into the field and muscle creep was hemostased with a bipolar and resected with a pituitary rongeur.  A Bovie extender was then used to expose the spinous process and lamina.  Careful attention was placed to not violate the facet capsule. A 3 mm matchstick drill bit was then used to make a hemi-laminotomy trough until the ligamentum flavum was exposed.  This was extended to the base of the spinous process and to the contralateral side to remove all the central bone from each side.  Once this was complete and the underlying ligamentum flavum was visualized, it was dissected with a curette and resected with Kerrison rongeurs.  Extensive ligamentum hypertrophy was noted, requiring a substantial amount of time and care for removal.  The dura was identified and palpated. The kerrison rongeur was then used to remove the medial facet bilaterally until no compression was noted.  A balltip probe was used to confirm decompression of the ipsilateral S1 nerve root.  Additional attention was paid to completion of the contralateral L5-S1 foraminotomy until the contralateral traversing nerve root was completely free.  Once this was complete, L5-S1 central decompression including medial facetectomy and foraminotomy was confirmed and decompression on both sides was confirmed. No CSF leak was noted.  A Depo-Medrol soaked Gelfoam pledget was placed in the defect.  The wound was copiously irrigated. The tube system was then removed under microscopic visualization and hemostasis was obtained with a bipolar.    The Metrx tubes were then sequentially advanced under lateral fluoroscopy until a 18 x 60 mm Metrx tube was placed over the Left L4-5 facet and lamina and secured to the bed.    The microscope was then  sterilely brought into the field and muscle creep was hemostased with a bipolar and resected with a pituitary rongeur.  A Bovie  extender was then used to expose the spinous process and lamina.  Careful attention was placed to not violate the facet capsule. A 3 mm matchstick drill bit was then used to make a hemi-laminotomy trough until the ligamentum flavum was exposed.  This was extended to the base of the spinous process.  Once this was complete and the underlying ligamentum flavum was visualized this was dissected with an up angle curette and resected with a #2 and #3 mm biting Kerrison.  The laminotomy opening was also expanded in similar fashion and hemostasis was obtained with Surgifoam and a patty as well as bone wax.  The rostral aspect of the caudal level of the lamina was also resected with a #2 biting Kerrison effort to further enhance exposure.  The drill was used to expand the laminectomy superiorly until at the level of the left L4 pedicle. Once the underlying dura was visualized a Penfield 4 was then used to dissect and expose the traversing nerve root.  Once this was identified a nerve root retractor suction was used to mobilize this medially.  The venous plexus was hemostased with Surgifoam and light bipolar use.  The small penfield was then used to make a small annulotomy within the disc space and disc space contents were noted to come through the annulus.    The disc herniation was identified and dissected free using a balltip probe. The disc herniation was mostly superior to the disc space behind the L4 vertebral body. The pituitary rongeur was used to remove the extruded disc fragments. Once the thecal sac and nerve root were noted to be relaxed and under less tension the ball-tipped feeler was passed along the foramen distally to to ensure no residual compression was noted.    A Depo-Medrol soaked Gelfoam pledget was placed along the nerve root for 2 minutes and removed.  The area was irrigated. The tube system was then removed under microscopic visualization and hemostasis was obtained with a bipolar.    The  fascial layer was reapproximated with the use of a 0- Vicryl suture.  Subcutaneous tissue layer was reapproximated using 2-0 Vicryl suture.  3-0 monocryl was used on the skin. The skin was then cleansed and Dermabond was used to close the skin opening.  Patient was then rotated back to the preoperative bed awakened from anesthesia and taken to recovery all counts are correct in this case.   I performed the entire procedure with the assistance of Marin Olp PA as an Pensions consultant.  Meade Maw MD

## 2018-12-24 NOTE — Progress Notes (Signed)
Procedure: L4-5 microdiscectomy and L5-S1 lumbar decompression Procedure date: 12/24/2018 Diagnosis: lumbar radiculoapthy  History: Larry Shaffer is s/p L4-5 microdiscectomy and L5-S1 lumbar decompression for lumbar radiculopathy  POD0: Tolerated procedure well without complication.  Denies any back pain or lower extremity pain at this time.  Feels that symptoms that were present prior to surgery have resolved.  However, he has not ambulated yet.  Catheter remains.  Physical Exam: Vitals:   12/24/18 1649 12/24/18 1743  BP: 139/61 (!) 148/63  Pulse: 73 76  Resp: 20 18  Temp: 97.9 F (36.6 C) 98.2 F (36.8 C)  SpO2: 97% 95%    AA Ox3 Strength:5/5 throughout lower extremities bilaterally Sensation: Intact and symmetric throughout lower extremities bilaterally Skin: Glue intact at incision site  Data:  No results for input(s): NA, K, CL, CO2, BUN, CREATININE, LABGLOM, GLUCOSE, CALCIUM in the last 168 hours. No results for input(s): AST, ALT, ALKPHOS in the last 168 hours.  Invalid input(s): TBILI   No results for input(s): WBC, HGB, HCT, PLT in the last 168 hours. No results for input(s): APTT, INR in the last 168 hours.       Other tests/results: No imaging reviewed  Assessment/Plan:  Larry Shaffer is POD0 s/p L4-5 and L5-S1 lumbar decompression. Will continue to monitor.   - mobilize - pain control - DVT prophylaxis - PTOT - Maintain catheter  Marin Olp PA-C Department of Neurosurgery

## 2018-12-24 NOTE — Consult Note (Signed)
Pharmacy Antibiotic Note  Larry Shaffer is a 78 y.o. male admitted on 12/24/2018 with surgical prophylaxis.  Pharmacy has been consulted for Cefazolin dosing.  Plan: Will order cefazolin 2 g x1 dose.   Weight: 231 lb 0.7 oz (104.8 kg)  Temp (24hrs), Avg:97.9 F (36.6 C), Min:97.9 F (36.6 C), Max:97.9 F (36.6 C)  No results for input(s): WBC, CREATININE, LATICACIDVEN, VANCOTROUGH, VANCOPEAK, VANCORANDOM, GENTTROUGH, GENTPEAK, GENTRANDOM, TOBRATROUGH, TOBRAPEAK, TOBRARND, AMIKACINPEAK, AMIKACINTROU, AMIKACIN in the last 168 hours.  Estimated Creatinine Clearance: 51.9 mL/min (A) (by C-G formula based on SCr of 1.4 mg/dL (H)).    No Known Allergies  Thank you for allowing pharmacy to be a part of this patient's care.  [name] 12/24/2018 9:37 AM

## 2018-12-24 NOTE — Transfer of Care (Signed)
Immediate Anesthesia Transfer of Care Note  Patient: Larry Shaffer  Procedure(s) Performed: Procedure(s): L4-5 MICRODISCECTOMY, L5-S1 LUMBAR DECOMPRESSION (Left)  Patient Location: PACU  Anesthesia Type:General  Level of Consciousness: sedated  Airway & Oxygen Therapy: Patient Spontanous Breathing and Patient connected to face mask oxygen  Post-op Assessment: Report given to RN and Post -op Vital signs reviewed and stable  Post vital signs: Reviewed and stable  Last Vitals:  Vitals:   12/24/18 0913 12/24/18 1413  BP: (!) 146/76 103/63  Pulse: 77 81  Resp: 18 17  Temp: 36.6 C 36.4 C  SpO2: 123456 0000000    Complications: No apparent anesthesia complications

## 2018-12-24 NOTE — H&P (Signed)
  I have reviewed and confirmed my history and physical from 12/04/2018 with no additions or changes. Plan for L L4-5 microdiscectomy and L5-S1 decompression.  Risks and benefits reviewed.  Heart sounds normal no MRG. Chest Clear to Auscultation Bilaterally.

## 2018-12-24 NOTE — Anesthesia Post-op Follow-up Note (Signed)
Anesthesia QCDR form completed.        

## 2018-12-24 NOTE — Discharge Instructions (Signed)

## 2018-12-25 ENCOUNTER — Encounter: Payer: Self-pay | Admitting: Neurosurgery

## 2018-12-25 DIAGNOSIS — M5116 Intervertebral disc disorders with radiculopathy, lumbar region: Secondary | ICD-10-CM | POA: Diagnosis not present

## 2018-12-25 LAB — GLUCOSE, CAPILLARY
Glucose-Capillary: 197 mg/dL — ABNORMAL HIGH (ref 70–99)
Glucose-Capillary: 203 mg/dL — ABNORMAL HIGH (ref 70–99)

## 2018-12-25 MED ORDER — OXYCODONE HCL 5 MG PO TABS: 5.0000 mg | ORAL_TABLET | ORAL | 0 refills | Status: DC | PRN

## 2018-12-25 MED ORDER — ACETAMINOPHEN 325 MG PO TABS: 650.0000 mg | ORAL_TABLET | ORAL | Status: AC | PRN

## 2018-12-25 MED ORDER — TIZANIDINE HCL 2 MG PO TABS: 2.0000 mg | ORAL_TABLET | Freq: Four times a day (QID) | ORAL | 0 refills | Status: AC | PRN

## 2018-12-25 NOTE — Progress Notes (Signed)
Patient ambulated in hall with physical therapy and tolerated well.  OT to see patient this PM.  Patient up in chair at this time and offers no c/o.

## 2018-12-25 NOTE — Progress Notes (Signed)
Received call from dining services stating patient refused lunch. Primary nurse notified.

## 2018-12-25 NOTE — Progress Notes (Signed)
Procedure: L4-5 microdiscectomy and L5-S1 lumbar decompression Procedure date: 12/24/2018 Diagnosis: lumbar radiculoapthy  History: Larry Shaffer is s/p L4-5 microdiscectomy and L5-S1 lumbar decompression for lumbar radiculopathy  POD1: Tolerated procedure well without complication.  Has not had leg pain, but has not walked.  POD0: Tolerated procedure well without complication.  Denies any back pain or lower extremity pain at this time.  Feels that symptoms that were present prior to surgery have resolved.  However, he has not ambulated yet.  Catheter remains.  Physical Exam: Vitals:   12/25/18 0402 12/25/18 0728  BP: 131/61 (!) 145/72  Pulse: 65 64  Resp: 18 17  Temp: 98 F (36.7 C) 97.8 F (36.6 C)  SpO2: 97% 99%    AA Ox3 Strength:5/5 throughout lower extremities bilaterally Sensation: Intact and symmetric throughout lower extremities bilaterally Skin: Glue intact at incision site  Data:  No results for input(s): NA, K, CL, CO2, BUN, CREATININE, LABGLOM, GLUCOSE, CALCIUM in the last 168 hours. No results for input(s): AST, ALT, ALKPHOS in the last 168 hours.  Invalid input(s): TBILI   No results for input(s): WBC, HGB, HCT, PLT in the last 168 hours. No results for input(s): APTT, INR in the last 168 hours.       Other tests/results: No imaging reviewed  Assessment/Plan:  Larry Shaffer is POD1 s/p L4-5 and L5-S1 lumbar decompression. Will continue to monitor.   - mobilize - pain control - DVT prophylaxis - PTOT - Maintain catheter for now - I will contact Dr. Sherilyn Cooter MD Department of Neurosurgery

## 2018-12-25 NOTE — Care Management Obs Status (Signed)
MEDICARE OBSERVATION STATUS NOTIFICATION   Patient Details  Name: Larry Shaffer MRN: [id number] Date of Birth: 1941-02-13   Medicare Observation Status Notification Given:  Yes    Rekha Hobbins, Lenice Llamas 12/25/2018, 9:49 AM

## 2018-12-25 NOTE — TOC Transition Note (Signed)
Transition of Care [facility]) - CM/SW Discharge Note   Patient Details  Name: Larry Shaffer MRN: [id number] Date of Birth: 07-Feb-1941  [facility] CM/SW Contact:  Samule Life, Lenice Llamas Phone Number: [phone]  12/25/2018, 4:49 PM   Clinical Narrative: Clinical Social Worker (CSW) notified [facility] home health agency representative that patient will D/C home today. Patient requested a rolling walker because his walker does not have the wheels on the front. Patient reported that he has a bedside commode at home. [facility] [employer] agency representative delivered rolling walker to bedside. Please reconsult if future social work needs arise. CSW signing off.     Final next level of care: [place] Barriers to Discharge: Barriers Resolved   Patient Goals and CMS Choice Patient states their goals for this hospitalization and ongoing recovery are:: To go home.      Discharge Placement                       Discharge Plan and Services In-house Referral: Clinical Social Work Discharge Planning Services: CM Consult            DME Arranged: Gilford Rile rolling DME Agency: [employer] Date DME Agency Contacted: 12/25/18 Time DME Agency Contacted: [phone] Representative spoke with at DME Agency: [place]: PT [place] Date [place]: 12/25/18 Time [place]: [place] Representative spoke with at [place]: Cassie  Social Determinants of Health ([place]) Interventions     Readmission Risk Interventions No flowsheet data found.

## 2018-12-25 NOTE — Discharge Summary (Signed)
Physician Discharge Summary  Patient ID: Larry Shaffer MRN: [id number] DOB/AGE: 1940/09/11 78 y.o.  Admit date: 12/24/2018 Discharge date: 12/25/2018  Admission Diagnoses: lumbar radiculopathy, cauda equina syndrome  Discharge Diagnoses:  Active Problems:   S/P lumbar discectomy   Discharged Condition: good  Hospital Course:  Larry Shaffer was admitted for l4-5 discectomy and l5-s1 decompression.  He did well with surgery and was ready for discharge home on POD1.    He has prior history of urinary dysfunction due to cauda equina syndrome.  Per Dr. Erlene Quan, he will continue follow up as an outpatient.  Consults: None  Significant Diagnostic Studies: none  Treatments: surgery: L4-5 discectomy, l5-s1 lumbar decompression  Discharge Exam: Blood pressure 129/65, pulse 70, temperature 98.7 F (37.1 C), temperature source Oral, resp. rate 16, height 5\' 8"  (1.727 m), weight 104.8 kg, SpO2 95 %. General appearance: alert, cooperative and appears stated age Neurologic - 5/5 to BLE  Disposition: Discharge disposition: 01-Home or Self Care       Discharge Instructions    Call MD for:  difficulty breathing, headache or visual disturbances   Complete by: As directed    Call MD for:  extreme fatigue   Complete by: As directed    Call MD for:  hives   Complete by: As directed    Call MD for:  persistant dizziness or light-headedness   Complete by: As directed    Call MD for:  persistant nausea and vomiting   Complete by: As directed    Call MD for:  redness, tenderness, or signs of infection (pain, swelling, redness, odor or green/yellow discharge around incision site)   Complete by: As directed    Call MD for:  severe uncontrolled pain   Complete by: As directed    Call MD for:  temperature >100.4   Complete by: As directed    Diet general   Complete by: As directed    Discharge instructions   Complete by: As directed    Lifting restrictions   Complete by: As directed    Do not lift more than 10 pounds for the first six weeks following surgery.     Allergies as of 12/25/2018   No Known Allergies     Medication List    TAKE these medications   acetaminophen 325 MG tablet Commonly known as: TYLENOL Take 2 tablets (650 mg total) by mouth every 4 (four) hours as needed for mild pain ((score 1 to 3) or temp > 100.5).   atorvastatin 20 MG tablet Commonly known as: LIPITOR Take 1 tablet by mouth daily.   famciclovir 500 MG tablet Commonly known as: FAMVIR Take 1 tablet by mouth 2 (two) times daily as needed (fever blisters).   glipiZIDE 10 MG 24 hr tablet Commonly known as: GLUCOTROL XL Take 10 mg by mouth 2 (two) times daily.   nystatin powder Commonly known as: MYCOSTATIN/NYSTOP Apply topically 2 (two) times daily.   oxyCODONE 5 MG immediate release tablet Commonly known as: Roxicodone Take 1 tablet (5 mg total) by mouth every 4 (four) hours as needed for breakthrough pain.   oxyCODONE 5 MG immediate release tablet Commonly known as: Oxy IR/ROXICODONE Take 1 tablet (5 mg total) by mouth every 3 (three) hours as needed for moderate pain ((score 4 to 6)).   pantoprazole 40 MG tablet Commonly known as: PROTONIX Take 40 mg by mouth daily.   sucralfate 1 g tablet Commonly known as: CARAFATE Take 1 g by mouth 2 (two) times  daily.   tamsulosin 0.4 MG Caps capsule Commonly known as: FLOMAX Take 0.4 mg by mouth daily.   tiZANidine 2 MG tablet Commonly known as: ZANAFLEX Take 1 tablet (2 mg total) by mouth every 6 (six) hours as needed for muscle spasms.   tiZANidine 2 MG tablet Commonly known as: ZANAFLEX Take 1 tablet (2 mg total) by mouth every 6 (six) hours as needed for muscle spasms.   Trulicity 1.5 0000000 Sopn Generic drug: Dulaglutide Inject 1.5 mg into the skin once a week. Sundays            Durable Medical Equipment  (From admission, onward)         Start     Ordered   12/25/18 1445  For home use only DME Walker  rolling  Once    Question:  Patient needs a walker to treat with the following condition  Answer:  Weakness   12/25/18 1445         Follow-up Information    Marin Olp, PA-C Follow up on 01/08/2019.   Specialty: Neurosurgery Contact information: [place] [id number] [phone]           Signed: Meade Maw 12/25/2018, 2:58 PM

## 2018-12-25 NOTE — Progress Notes (Signed)
Patient has ordered soup for lunch.

## 2018-12-25 NOTE — Anesthesia Postprocedure Evaluation (Signed)
Anesthesia Post Note  Patient: GOKU KELEMAN  Procedure(s) Performed: L4-5 MICRODISCECTOMY, L5-S1 LUMBAR DECOMPRESSION (Left )  Patient location during evaluation: PACU Anesthesia Type: General Level of consciousness: awake and alert Pain management: pain level controlled Vital Signs Assessment: post-procedure vital signs reviewed and stable Respiratory status: spontaneous breathing, nonlabored ventilation, respiratory function stable and patient connected to nasal cannula oxygen Cardiovascular status: blood pressure returned to baseline and stable Postop Assessment: no apparent nausea or vomiting Anesthetic complications: no     Last Vitals:  Vitals:   12/25/18 0728 12/25/18 1148  BP: (!) 145/72 129/65  Pulse: 64 70  Resp: 17 16  Temp: 36.6 C 37.1 C  SpO2: 99% 95%    Last Pain:  Vitals:   12/25/18 1148  TempSrc: Oral  PainSc:                  Precious Haws [place]

## 2018-12-25 NOTE — TOC Initial Note (Signed)
Transition of Care [facility]) - Initial/Assessment Note    Patient Details  Name: Larry Shaffer MRN: [id number] Date of Birth: [birth year]  [facility]) CM/SW Contact:    Chrystopher Stangl, Lenice Llamas Phone Number: [phone]  12/25/2018, 2:32 PM  Clinical Narrative: Clinical Social Worker (CSW) met with patient to discuss D/C plan. Patient was alert and oriented X4 and was sitting up in the bed. CSW introduced self and explained role of CSW department. Per patient he lives in [place] with his wife Madlyn Frankel and has a walker at home. Per patient he received a call from [facility] home [facility] prior to surgery and reported that he is set up with them. Cassie [facility] home health representative confirmed they will start services with patient once he discharges home. Per Cassie they received home health orders from the surgeon's office and do not need home health orders from the hospital. Patient reported that he self caths since July 2020. Patient reported no other needs or concerns. CSW will continue to follow and assist as needed.                   Expected Discharge Plan: [place] Barriers to Discharge: Continued Medical Work up   Patient Goals and CMS Choice Patient states their goals for this hospitalization and ongoing recovery are:: To go home.      Expected Discharge Plan and Services Expected Discharge Plan: [place] In-house Referral: Clinical Social Work Discharge Planning Services: CM Consult   Living arrangements for the past 2 months: [place]                 DME Arranged: N/A         HH Arranged: PT HH Agency: [facility] Date [place]: 12/25/18 Time [place]: 1243 Representative spoke with at [place] Arrangements/Services Living arrangements for the past 2 months: [place] with:: Spouse Patient language and need for interpreter  reviewed:: No Do you feel safe going back to the place where you live?: Yes      Need for Family Participation in Patient Care: No (Comment) Care giver support system in place?: Yes (comment) Current home services: DME(Patient has a walker at home.) Criminal Activity/Legal Involvement Pertinent to Current Situation/Hospitalization: No - Comment as needed  Activities of Daily Living Home Assistive Devices/Equipment: Gilford Rile (specify type) ADL Screening (condition at time of admission) Patient's cognitive ability adequate to safely complete daily activities?: Yes Is the patient deaf or have difficulty hearing?: No Does the patient have difficulty seeing, even when wearing glasses/contacts?: No Does the patient have difficulty concentrating, remembering, or making decisions?: No Patient able to express need for assistance with ADLs?: Yes Does the patient have difficulty dressing or bathing?: No Independently performs ADLs?: No Communication: Independent Dressing (OT): Needs assistance Is this a change from baseline?: Change from baseline, expected to last <3days Grooming: Needs assistance Is this a change from baseline?: Change from baseline, expected to last <3 days Feeding: Independent Bathing: Needs assistance Is this a change from baseline?: Change from baseline, expected to last <3 days Toileting: Needs assistance Is this a change from baseline?: Change from baseline, expected to last <3 days In/Out Bed: Needs assistance Is this a change from baseline?: Change from baseline, expected to last <3 days Walks in Home: Needs assistance Is this a change from baseline?: Change from baseline, expected to last <3 days  Does the patient have difficulty walking or climbing stairs?: Yes Weakness of Legs: None Weakness of Arms/Hands: None  Permission Sought/Granted Permission sought to share information with : Other (comment)(Home Health agency.) Permission granted to share information with :  Yes, Verbal Permission Granted              Emotional Assessment Appearance:: Appears stated age Attitude/Demeanor/Rapport: Engaged Affect (typically observed): Pleasant, Calm Orientation: : Oriented to Self, Oriented to Place, Oriented to  Time, Oriented to Situation Alcohol / Substance Use: Not Applicable Psych Involvement: No (comment)  Admission diagnosis:  Cauda equina syndrome G83.4  Lumbar radiculopathy M54.16  Neurogenic claudication due to lumbar spinal stenosis M48.062 Patient Active Problem List   Diagnosis Date Noted  . S/P lumbar discectomy 12/24/2018  . Lumbar radiculopathy 02/04/2017  . 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 Spinal stenosis of lumbar region without neurogenic claudication 02/04/2017  . Acute right-sided low back pain with right-sided sciatica 02/04/2017   PCP:  Tracie Harrier, MD Pharmacy:   [id number] [place], [id number]: [phone]: [phone]    Social Determinants of Health (SDOH) Interventions    Readmission Risk Interventions No flowsheet data found.

## 2018-12-25 NOTE — Evaluation (Signed)
Physical Therapy Evaluation Patient Details Name: Larry Shaffer MRN: [id number] DOB: 1941/02/07 Today's Date: 12/25/2018   History of Present Illness  78 y.o male admitted 12/24/2018 with lumbar radiculopathy and cauda equina syndrome diagnosis of large L4-5 disc herniation. Now s/p L5-S1 lumbar decompression including central laminectomy and bilateral medial facetectomies including foraminotomies and Left L4-5 microdiscectomy. PHM includes: diabetes, oral hypolgycemic, and Peptic ulcer disease.  Clinical Impression  Prior to hospital admission, pt was modified independent with SW in the morning due to stiff and pain.  Pt lives with his wife in a 2 story house with 6 steps entry with R side of railing.  Currently pt is supervision to [place] with mobility assessment today. Trialed with reduced assistive level with ambulation. Pt appeared more comfortable and steady gait with RW. Precautions reviewed and pt able recall 3/4 at the end of the session. Vital monitored on room air and stable throughout the session.  Will progress to home exercise plan, precaution education, and ambulation in later session. Pt would benefit from skilled PT to address noted impairments and functional limitations (see below for any additional details).  Upon hospital discharge, recommend pt discharge to [place] to improve balance, precaution, muscle endurance for functional mobility.      Follow Up Recommendations Home health PT    Equipment Recommendations  Rolling walker with 5" wheels    Recommendations for Other Services       Precautions / Restrictions Precautions Precautions: Back Precaution Comments: No bending/lifting/twisting/arching Restrictions Weight Bearing Restrictions: No      Mobility  Bed Mobility Overal bed mobility: Needs Assistance Bed Mobility: Supine to Sit     Supine to sit: Supervision     General bed mobility comments: vc's and tactile cuing providef for log rolling toward R on the  bed.  Transfers Overall transfer level: Needs assistance Equipment used: Rolling walker (2 wheeled) Transfers: Sit to/from Stand Sit to Stand: Supervision         General transfer comment: Vc's provided for hand and foot positioning with RW and able to stand up without sign of loss of balance.  Ambulation/Gait Ambulation/Gait assistance: Min guard;Supervision Gait Distance (Feet): (120 feet + stairs +120 feet) Assistive device: Rolling walker (2 wheeled);1 person hand held assist;None Gait Pattern/deviations: Step-through pattern Gait velocity: decreased   General Gait Details: Ambulation trialed assistance level from RW to one person hand held and without AD. Pt able to ambulate with minimal postural sway without AD but apeared more confident and increased steps length with RW.  Stairs Stairs: Yes Stairs assistance: Supervision Stair Management: One rail Right;Step to pattern Number of Stairs: 4 General stair comments: Increase time with minimal postural sway.  Wheelchair Mobility    Modified Rankin (Stroke Patients Only)       Balance Overall balance assessment: Modified Independent                                           Pertinent Vitals/Pain Pain Assessment: No/denies pain    Home Living Family/patient expects to be discharged to:: Private residence Living Arrangements: Spouse/significant other Available Help at Discharge: Family Type of Home: House Home Access: Stairs to enter Entrance Stairs-Rails: Right Entrance Stairs-Number of Steps: 6 Home Layout: Two level Home Equipment: Walker - standard;Cane - quad;Toilet riser      Prior Function Level of Independence: Independent with assistive device(s)  Comments: Use walker in the morning due to stiffness and pain.     Hand Dominance   Dominant Hand: Right    Extremity/Trunk Assessment   Upper Extremity Assessment Upper Extremity Assessment: Overall WFL for tasks  assessed(Grossly 5/5 with BUE)    Lower Extremity Assessment Lower Extremity Assessment: Overall WFL for tasks assessed(grossly 5/5 with BLE)    Cervical / Trunk Assessment Cervical / Trunk Assessment: Normal  Communication   Communication: No difficulties  Cognition Arousal/Alertness: Awake/alert Behavior During Therapy: WFL for tasks assessed/performed Overall Cognitive Status: Within Functional Limits for tasks assessed                                        General Comments General comments (skin integrity, edema, etc.): surgical site is intact and beginning and end of the session.    Exercises Total Joint Exercises Ankle Circles/Pumps: AROM;Strengthening;Both;10 reps;Supine   Assessment/Plan    PT Assessment Patient needs continued PT services  PT Problem List Decreased range of motion;Decreased activity tolerance;Decreased balance;Decreased mobility;Decreased knowledge of use of DME       PT Treatment Interventions DME instruction;Gait training;Stair training;Functional mobility training;Therapeutic activities;Therapeutic exercise;Balance training    PT Goals (Current goals can be found in the Care Plan section)  Acute Rehab PT Goals Patient Stated Goal: to play golf PT Goal Formulation: With patient Time For Goal Achievement: 01/09/19 Potential to Achieve Goals: Good    Frequency BID   Barriers to discharge        Co-evaluation               AM-PAC PT "6 Clicks" Mobility  Outcome Measure Help needed turning from your back to your side while in a flat bed without using bedrails?: None Help needed moving from lying on your back to sitting on the side of a flat bed without using bedrails?: A Little Help needed moving to and from a bed to a chair (including a wheelchair)?: A Little Help needed standing up from a chair using your arms (e.g., wheelchair or bedside chair)?: A Little Help needed to walk in hospital room?: A Little Help  needed climbing 3-5 steps with a railing? : A Little 6 Click Score: 19    End of Session Equipment Utilized During Treatment: Gait belt Activity Tolerance: Patient tolerated treatment well;No increased pain Patient left: in chair;with call bell/phone within reach;with chair alarm set;with SCD's reapplied(Pt request keep BLE knee flexion and foot rest on groud for comfort.) Nurse Communication: Mobility status;Precautions;Weight bearing status PT Visit Diagnosis: Unsteadiness on feet (R26.81);Other abnormalities of gait and mobility (R26.89);Difficulty in walking, not elsewhere classified (R26.2)    Time: [id number] PT Time Calculation (min) (ACUTE ONLY): 42 min   Charges:              Sherrilyn Rist, SPT 12/25/2018, 12:45 PM

## 2018-12-25 NOTE — Progress Notes (Signed)
Call out to Dr. Izora Ribas for d/c order for pt.

## 2018-12-25 NOTE — Evaluation (Signed)
Occupational Therapy Evaluation Patient Details Name: Larry Shaffer MRN: [id number] DOB: Aug 04, 1940 Today's Date: 12/25/2018    History of Present Illness 78 y.o male admitted 12/24/2018 with lumbar radiculopathy and cauda equina syndrome diagnosis of large L4-5 disc herniation. Now s/p L5-S1 lumbar decompression including central laminectomy and bilateral medial facetectomies including foraminotomies and Left L4-5 microdiscectomy. PHM includes: diabetes, oral hypolgycemic, and Peptic ulcer disease.   Clinical Impression   Mr. Sulkowski was seen for OT evaluation this date, POD#1 from above surgery. Prior to hospital admission, pt was independent with mobility, ADL, and IADL. No falls in past 12 months. Pt lives with spouse in a two level home with 6 steps to enter and R handrail with spouse able to provide 24/7 assist/support as needed for pt. Pt reports he is able to live on the first floor of his house during initial recovery. Currently pt is supervision for safety with all aspects of mobility and self care tasks. Pt educated in back precautions precautions or the "BLT's", self care skills, AE, and home/routines modifications to maximize safety and functional independence while minimizing falls risk and maintaining precautions. Pt verbalized understanding of all education/training provided. Able to return demonstration safe techniques while maintaining back precautions during trial of AE for LB dressing. Handout provided to support recall and carry over of learned precautions/techniques for bed mobility, functional transfers, and self care skills.  Pt would benefit from skilled OT to address noted impairments and functional limitations (see below for any additional details) in order to maximize safety and independence while minimizing falls risk and caregiver burden. Upon hospital discharge, recommend HHOT to maximize pt safety and support functional independence during meaningful occupations of daily  life .      Follow Up Recommendations  Home health OT    Equipment Recommendations  3 in 1 bedside commode    Recommendations for Other Services       Precautions / Restrictions Precautions Precautions: Back Precaution Comments: No bending/lifting/twisting/arching Restrictions Weight Bearing Restrictions: No      Mobility Bed Mobility Overal bed mobility: Needs Assistance Bed Mobility: Supine to Sit     Supine to sit: Supervision     General bed mobility comments: Deferred. Pt up in recliner at start/end of session.  Transfers Overall transfer level: Needs assistance Equipment used: None Transfers: Sit to/from Stand Sit to Stand: Supervision         General transfer comment: Vc's provided for hand and foot positioning with RW and able to stand up without sign of loss of balance.    Balance Overall balance assessment: Modified Independent;No apparent balance deficits (not formally assessed)                                         ADL either performed or assessed with clinical judgement   ADL Overall ADL's : Needs assistance/impaired Eating/Feeding: Sitting;Independent   Grooming: Sitting;Cueing for safety;Set up;Independent   Upper Body Bathing: Sitting;With adaptive equipment;Cueing for safety;Modified independent   Lower Body Bathing: Sit to/from stand;Cueing for back precautions;Supervison/ safety   Upper Body Dressing : Sitting;Independent   Lower Body Dressing: With adaptive equipment;Sit to/from stand;Supervision/safety;Cueing for back precautions   Toilet Transfer: BSC;Cueing for safety;RW   Toileting- Clothing Manipulation and Hygiene: Set up;Sit to/from stand;Cueing for safety;Cueing for back precautions;Supervision/safety       Functional mobility during ADLs: Supervision/safety General ADL Comments: Pt able to return  demonstrate use of sock aid with min VCs for technique and adherence to back precautions. Overall good  safety awareness and recall of eduation provided.     Vision Baseline Vision/History: Wears glasses Wears Glasses: Distance only;Reading only Patient Visual Report: No change from baseline       Perception     Praxis      Pertinent Vitals/Pain Pain Assessment: No/denies pain     Hand Dominance Right   Extremity/Trunk Assessment Upper Extremity Assessment Upper Extremity Assessment: Overall WFL for tasks assessed   Lower Extremity Assessment Lower Extremity Assessment: Overall WFL for tasks assessed;Defer to PT evaluation   Cervical / Trunk Assessment Cervical / Trunk Assessment: Normal   Communication Communication Communication: No difficulties   Cognition Arousal/Alertness: Awake/alert Behavior During Therapy: WFL for tasks assessed/performed Overall Cognitive Status: Within Functional Limits for tasks assessed                                     General Comments  surgical site is intact and beginning and end of the session.    Exercises Total Joint Exercises Ankle Circles/Pumps: AROM;Strengthening;Both;10 reps;Supine Other Exercises Other Exercises: Pt educated in falls prevention strategies, back precaution mgt and implemenation during ADL, safe use of AE for LB ADL, and compression stocking mgt. Back precaution handout provided.   Shoulder Instructions      Home Living Family/patient expects to be discharged to:: Private residence Living Arrangements: Spouse/significant other Available Help at Discharge: Family Type of Home: House Home Access: Stairs to enter Technical brewer of Steps: 6 Entrance Stairs-Rails: Right Home Layout: Two level Alternate Level Stairs-Number of Steps: Able to stay in first level   Bathroom Shower/Tub: Occupational psychologist: Standard(Pt has access to toilet riser.) Bathroom Accessibility: Yes   Home Equipment: Walker - standard;Cane - quad;Toilet riser;Adaptive equipment Adaptive Equipment:  Reacher        Prior Functioning/Environment Level of Independence: Independent with assistive device(s)        Comments: Use walker in the morning due to stiffness and pain.        OT Problem List: Decreased strength;Decreased activity tolerance;Decreased safety awareness;Decreased knowledge of use of DME or AE;Decreased knowledge of precautions      OT Treatment/Interventions: Self-care/ADL training;Therapeutic exercise;Therapeutic activities;Balance training;DME and/or AE instruction;Patient/family education    OT Goals(Current goals can be found in the care plan section) Acute Rehab OT Goals Patient Stated Goal: to play golf again OT Goal Formulation: With patient Time For Goal Achievement: 01/08/19 Potential to Achieve Goals: Good ADL Goals Pt Will Perform Grooming: with modified independence;sitting(With LRAD PRN for improved safety and functional independence) Pt Will Perform Lower Body Bathing: with adaptive equipment;with modified independence;sit to/from stand(With LRAD PRN for improved safety and functional independence) Pt Will Perform Lower Body Dressing: sit to/from stand;with modified independence;with adaptive equipment Pt Will Transfer to Toilet: with modified independence;ambulating;bedside commode(With LRAD PRN for improved safety and functional independence)  OT Frequency: Min 1X/week   Barriers to D/C:            Co-evaluation              AM-PAC OT "6 Clicks" Daily Activity     Outcome Measure Help from another person eating meals?: None Help from another person taking care of personal grooming?: A Little Help from another person toileting, which includes using toliet, bedpan, or urinal?: A Little Help from another person  bathing (including washing, rinsing, drying)?: A Little Help from another person to put on and taking off regular upper body clothing?: A Little Help from another person to put on and taking off regular lower body clothing?: A  Little 6 Click Score: 19   End of Session Equipment Utilized During Treatment: Gait belt  Activity Tolerance: Patient tolerated treatment well Patient left: in chair;with call bell/phone within reach;with chair alarm set;with SCD's reapplied  OT Visit Diagnosis: Other abnormalities of gait and mobility (R26.89)                Time: 1336-1400 OT Time Calculation (min): 24 min Charges:  OT General Charges $OT Visit: 1 Visit OT Evaluation $OT Eval Low Complexity: 1 Low OT Treatments $Self Care/Home Management : 23-37 mins  Shara Blazing, M.S., OTR/L Ascom: [phone] 12/25/18, 3:02 PM

## 2018-12-25 NOTE — Progress Notes (Signed)
Patient will be discharging today from unit to home. Wife will provided transportation.  Indwelling foley cath removed and patient will resume straight caths at home as he is accustomed to. HH will follow at home.  582mls of clear yellow urine emptied from foley bag.

## 2019-01-26 ENCOUNTER — Telehealth: Payer: Self-pay | Admitting: Urology

## 2019-01-26 NOTE — Telephone Encounter (Addendum)
Faxed order

## 2019-01-26 NOTE — Telephone Encounter (Signed)
Pt called office and would like his order for self catheters to be changed from 4 to 5 per day sent to [facility].

## 2019-02-15 ENCOUNTER — Encounter: Payer: Self-pay | Admitting: Neurosurgery

## 2019-02-17 ENCOUNTER — Encounter: Payer: Self-pay | Admitting: Urology

## 2019-02-17 ENCOUNTER — Other Ambulatory Visit: Payer: Self-pay

## 2019-02-17 ENCOUNTER — Ambulatory Visit (INDEPENDENT_AMBULATORY_CARE_PROVIDER_SITE_OTHER): Payer: Medicare Other | Admitting: Urology

## 2019-02-17 VITALS — BP 157/77 | HR 79 | Ht 65.0 in | Wt 277.0 lb

## 2019-02-17 DIAGNOSIS — N319 Neuromuscular dysfunction of bladder, unspecified: Secondary | ICD-10-CM | POA: Diagnosis not present

## 2019-02-17 NOTE — Progress Notes (Signed)
02/17/2019 2:39 PM   Larry Shaffer 07-18-1940 [id number]  Referring provider: Tracie Harrier, MD [address] [facility] [place],  [place] [address]  Chief Complaint  Patient presents with  . 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  He is now status post L4-L5 discectomy and L5-S1 decompression for history of lumbar radiculopathy and cauda equina syndrome.  Please see previous notes for details.  He is status post urodynamics demonstrating significant impaired bladder sensation and bladder atony.  He is now on self cath 3-4 times daily.  He has no urgency or desire to void.  He is not voiding spontaneously at all.  He reports then daytime, he caths for fairly low/reasonable volumes anywhere from 300 500 cc.  He castrate before he goes to bed and wakes himself up around 3 AM to cath but will still cath for 800 cc to a liter at this time.  He has been trying to manage his fluids before it at bedtime to reduce this volume.  He has had a few incidences where he seen blood on the catheter of had difficulty getting the catheter in.  Other than this, he is getting used to cathing.  He is concerned today that this will be a permanent condition.  He has lots of questions regarding this issue.  He has recovered nicely from surgery.  His pain has improved significantly.  He has no other neurological deficits.    PMH: Past Medical History:  Diagnosis Date  . Diabetes mellitus without complication ([place])   . Gastric ulcer   . Gastric ulcer   . Hyperlipidemia   . Hyperlipidemia   . Obesity     Surgical History: Past Surgical History:  Procedure Laterality Date  . COLONOSCOPY WITH PROPOFOL N/A 12/02/2017   Procedure: COLONOSCOPY WITH PROPOFOL;  Surgeon: Manya Silvas, MD;  Location[facility] ENDOSCOPY;  Service: Endoscopy;  Laterality: N/A;  . ESOPHAGOGASTRODUODENOSCOPY (EGD) WITH  PROPOFOL N/A 12/02/2017   Procedure: ESOPHAGOGASTRODUODENOSCOPY (EGD) WITH PROPOFOL;  Surgeon: Manya Silvas, MD;  Location: [facility] ENDOSCOPY;  Service: Endoscopy;  Laterality: N/A;  . KNEE ARTHROSCOPY Bilateral 2003, 2004  . LUMBAR LAMINECTOMY/DECOMPRESSION MICRODISCECTOMY Left 12/24/2018   Procedure: L4-5 MICRODISCECTOMY, L5-S1 LUMBAR DECOMPRESSION;  Surgeon: Meade Maw, MD;  Location: [facility] ORS;  Service: Neurosurgery;  Laterality: Left;  . tonsillectomty  1947  . TONSILLECTOMY    . TREATMENT FISTULA ANAL      Home Medications:  Allergies as of 02/17/2019   No Known Allergies     Medication List       Accurate as of February 17, 2019 11:59 PM. If you have any questions, ask your nurse or doctor.        STOP taking these medications   oxyCODONE 5 MG immediate release tablet Commonly known as: Oxy IR/ROXICODONE Stopped by: Hollice Espy, MD     TAKE these medications   atorvastatin 20 MG tablet Commonly known as: LIPITOR Take 1 tablet by mouth daily.   doxycycline 100 MG capsule Commonly known as: VIBRAMYCIN TAKE 1 CAPSULE BY MOUTH TWICE A DAY FOR 10 DAYS   famciclovir 500 MG tablet Commonly known as: FAMVIR Take 1 tablet by mouth 2 (two) times daily as needed (fever blisters).   gabapentin 300 MG capsule Commonly known as: NEURONTIN TAKE 1 CAPSULE (300 MG TOTAL) BY MOUTH 3 (THREE) TIMES DAILY FOR 30 DAYS   glipiZIDE 10 MG 24  hr tablet Commonly known as: GLUCOTROL XL Take 10 mg by mouth 2 (two) times daily.   nystatin powder Commonly known as: MYCOSTATIN/NYSTOP Apply topically 2 (two) times daily.   pantoprazole 40 MG tablet Commonly known as: PROTONIX Take 40 mg by mouth daily.   sucralfate 1 g tablet Commonly known as: CARAFATE Take 1 g by mouth 2 (two) times daily.   tamsulosin 0.4 MG Caps capsule Commonly known as: FLOMAX Take 0.4 mg by mouth daily.   tiZANidine 2 MG tablet Commonly known as: ZANAFLEX Take 1 tablet (2 mg total) by mouth  every 6 (six) hours as needed for muscle spasms.   Trulicity 1.5 0000000 Sopn Generic drug: Dulaglutide Inject 1.5 mg into the skin once a week. Sundays       Allergies: No Known Allergies  Family History: Family History  Problem Relation Age of Onset  . Stroke Mother   . Diabetes Mother   . Heart disease Father   . Cancer Sister     Social History:  reports that he has never smoked. He has never used smokeless tobacco. He reports current alcohol use. He reports that he does not use drugs.  ROS: UROLOGY Frequent Urination?: No Hard to postpone urination?: No Burning/pain with urination?: No Get up at night to urinate?: No Leakage of urine?: No Urine stream starts and stops?: Yes Trouble starting stream?: No Do you have to strain to urinate?: No Blood in urine?: No Urinary tract infection?: Yes Sexually transmitted disease?: No Injury to kidneys or bladder?: No Painful intercourse?: No Weak stream?: No Erection problems?: No Penile pain?: No  Gastrointestinal Nausea?: No Vomiting?: No Indigestion/heartburn?: No Diarrhea?: No Constipation?: No  Constitutional Fever: No Night sweats?: No Weight loss?: No Fatigue?: No  Skin Skin rash/lesions?: No Itching?: No  Eyes Blurred vision?: No Double vision?: No  Ears/Nose/Throat Sore throat?: No Sinus problems?: No  Hematologic/Lymphatic Swollen glands?: No Easy bruising?: No  Cardiovascular Leg swelling?: No Chest pain?: No  Respiratory Cough?: No Shortness of breath?: No  Endocrine Excessive thirst?: No  Musculoskeletal Back pain?: Yes Joint pain?: No  Neurological Headaches?: No Dizziness?: No  Psychologic Depression?: No Anxiety?: No  Physical Exam: BP (!) 157/77   Pulse 79   Ht 5\' 5"  (1.651 m)   Wt 277 lb (125.6 kg)   BMI 46.10 kg/m   Constitutional:  Alert and oriented, No acute distress. HEENT: Power AT, moist mucus membranes.  Trachea midline, no masses. Cardiovascular:  No clubbing, cyanosis, or edema. Respiratory: Normal respiratory effort, no increased work of breathing. Skin: No rashes, bruises or suspicious lesions. Neurologic: Grossly intact, no focal deficits, moving all 4 extremities. Psychiatric: Normal mood and affect.  Laboratory Data: Lab Results  Component Value Date   WBC 9.0 12/17/2018   HGB 15.2 12/17/2018   HCT 44.4 12/17/2018   MCV 96.9 12/17/2018   PLT 283 12/17/2018    Lab Results  Component Value Date   CREATININE 1.40 (H) 12/17/2018     Lab Results  Component Value Date   HGBA1C 7.7 (H) 12/24/2018      Assessment & Plan:    1. Neurogenic bladder Lengthy discussion today that unfortunately, with the degree of his severe neuropathy, lack of sensation and bladder atony, I am relatively pessimistic that he will he will regain any significant function of his bladder  We again discussed bladder management options including CIC, chronic indwelling catheter versus SP tube amongst others.  He will prefer to continue self cath.  Encouraged  to keep volumes under 500 cc, discussed strategies overnight including fluid management and shifting of nighttime cath.  He is agreeable this plan.  He also questions today while he was able to void prior to initiating CIC.  We reviewed again today that this was likely secondary to overflow with Crede maneuver which he agrees was the likely case.  Recommend annual follow-up with upper tract imaging and cystoscopy for patients who have chronic indwelling Foley catheters or self cath.  He is agreeable this plan.   Return in about 1 year (around 02/17/2020) for cysto.  Hollice Espy, MD  [facility] [address], [place], [place] [address] [phone]

## 2019-03-09 ENCOUNTER — Encounter: Payer: Self-pay | Admitting: Urology

## 2019-07-22 ENCOUNTER — Other Ambulatory Visit: Payer: Self-pay

## 2019-07-22 ENCOUNTER — Ambulatory Visit (INDEPENDENT_AMBULATORY_CARE_PROVIDER_SITE_OTHER): Payer: Medicare Other

## 2019-07-22 DIAGNOSIS — L570 Actinic keratosis: Secondary | ICD-10-CM | POA: Diagnosis not present

## 2019-07-22 NOTE — Patient Instructions (Signed)

## 2019-07-22 NOTE — Progress Notes (Signed)
Patient completed PDT therapy today.  AK (actinic keratosis) Head - Anterior (Face)  Photodynamic therapy - Head - Anterior (Face)

## 2019-07-23 MED ORDER — AMINOLEVULINIC ACID HCL 20 % EX SOLR
1.0000 "application " | Freq: Once | CUTANEOUS | Status: AC
  Administered 2019-07-22: 14:00:00 354 mg via TOPICAL

## 2019-09-23 ENCOUNTER — Ambulatory Visit (INDEPENDENT_AMBULATORY_CARE_PROVIDER_SITE_OTHER): Payer: Medicare Other | Admitting: Dermatology

## 2019-09-23 ENCOUNTER — Other Ambulatory Visit: Payer: Self-pay

## 2019-09-23 DIAGNOSIS — L570 Actinic keratosis: Secondary | ICD-10-CM

## 2019-09-23 DIAGNOSIS — L578 Other skin changes due to chronic exposure to nonionizing radiation: Secondary | ICD-10-CM | POA: Diagnosis not present

## 2019-09-23 DIAGNOSIS — D485 Neoplasm of uncertain behavior of skin: Secondary | ICD-10-CM | POA: Diagnosis not present

## 2019-09-23 NOTE — Progress Notes (Addendum)
   Follow-Up Visit   Subjective  Larry Shaffer is a 79 y.o. male who presents for the following: Actinic Keratosis (recheck for new or persistent AK's of the face and ears) and lesion (of the left ear - treated in the past but persistent and thick).  The following portions of the chart were reviewed this encounter and updated as appropriate:  Tobacco  Allergies  Meds  Problems  Med Hx  Surg Hx  Fam Hx     Review of Systems:  No other skin or systemic complaints except as noted in HPI or Assessment and Plan.  Objective  Well appearing patient in no apparent distress; mood and affect are within normal limits.  A focused examination was performed including the face, scalp, and ears. Relevant physical exam findings are noted in the Assessment and Plan.  Objective  L ear concha post crease at the junction of the concha and the mid anti helix: 1.0 cm scaly patch    Assessment & Plan    Neoplasm of uncertain behavior of skin L ear concha post crease at the junction of the concha and the mid anti helix  Skin / nail biopsy Type of biopsy: tangential   Informed consent: discussed and consent obtained   Anesthesia: the lesion was anesthetized in a standard fashion   Anesthetic:  1% lidocaine w/ epinephrine 1-100,000 buffered w/ 8.4% NaHCO3 Instrument used: #15 blade   Outcome: patient tolerated procedure well    Specimen 1 - Surgical pathology Differential Diagnosis: D48.5 ISK vs AK r/o CA Check Margins: No 1.0 cm scaly patch  AK (actinic keratosis) (4) L to midline sup forehead x 1, L temple/lat canthus x 1, L mid eyebrow x 1, L mid lat nose x 1  Destruction of lesion - L to midline sup forehead x 1, L temple/lat canthus x 1, L mid eyebrow x 1, L mid lat nose x 1 Complexity: simple   Destruction method: cryotherapy   Informed consent: discussed and consent obtained   Timeout:  patient name, date of birth, surgical site, and procedure verified Lesion destroyed using  liquid nitrogen: Yes   Region frozen until ice ball extended beyond lesion: Yes   Outcome: patient tolerated procedure well with no complications   Post-procedure details: wound care instructions given     Actinic Damage - diffuse scaly erythematous macules with underlying dyspigmentation - Recommend daily broad spectrum sunscreen SPF 30+ to sun-exposed areas, reapply every 2 hours as needed.  - Call for new or changing lesions.  Return in about 4 months (around 01/24/2020).  Luther Redo, CMA, am acting as scribe for Sarina Ser, MD . Documentation: I have reviewed the above documentation for accuracy and completeness, and I agree with the above.  Sarina Ser, MD

## 2019-09-23 NOTE — Patient Instructions (Signed)

## 2019-09-25 ENCOUNTER — Encounter: Payer: Self-pay | Admitting: Dermatology

## 2019-09-29 ENCOUNTER — Telehealth: Payer: Self-pay

## 2019-09-29 NOTE — Telephone Encounter (Signed)
Discussed biopsy results with pt  °

## 2019-09-29 NOTE — Telephone Encounter (Signed)
-----   Message from Ralene Bathe, MD sent at 09/25/2019 10:20 AM EDT ----- Skin , left ear concha post crease at the junction of the concha and the mid antihelix Nixa  PreCancer changes Schedule for Ln2 Then plan topical treatment

## 2019-10-12 NOTE — Addendum Note (Signed)
Addended by: Ralene Bathe on: 10/12/2019 12:48 PM   Modules accepted: Level of Service

## 2020-01-13 ENCOUNTER — Other Ambulatory Visit: Payer: Self-pay

## 2020-01-13 ENCOUNTER — Ambulatory Visit (INDEPENDENT_AMBULATORY_CARE_PROVIDER_SITE_OTHER): Payer: Medicare Other | Admitting: Dermatology

## 2020-01-13 ENCOUNTER — Encounter: Payer: Self-pay | Admitting: Dermatology

## 2020-01-13 DIAGNOSIS — L219 Seborrheic dermatitis, unspecified: Secondary | ICD-10-CM

## 2020-01-13 DIAGNOSIS — B009 Herpesviral infection, unspecified: Secondary | ICD-10-CM | POA: Diagnosis not present

## 2020-01-13 DIAGNOSIS — D485 Neoplasm of uncertain behavior of skin: Secondary | ICD-10-CM

## 2020-01-13 DIAGNOSIS — L578 Other skin changes due to chronic exposure to nonionizing radiation: Secondary | ICD-10-CM | POA: Diagnosis not present

## 2020-01-13 DIAGNOSIS — L570 Actinic keratosis: Secondary | ICD-10-CM | POA: Diagnosis not present

## 2020-01-13 MED ORDER — KETOCONAZOLE 2 % EX CREA: 1.0000 "application " | TOPICAL_CREAM | CUTANEOUS | 6 refills | Status: AC

## 2020-01-13 MED ORDER — HYDROCORTISONE 2.5 % EX LOTN: TOPICAL_LOTION | CUTANEOUS | 6 refills | Status: DC

## 2020-01-13 NOTE — Progress Notes (Signed)
Follow-Up Visit   Subjective  Larry Shaffer is a 79 y.o. male who presents for the following: Actinic Keratosis (follow up - left of midline sup forehead, left temple/lat canthus, left mid brow, left mid lat nose - treated with LN2), Follow-up (Biopsy follow up - left ear concha post crease at junction of concha and mid antihelix - AK/SK), and Other (History of fever blisters - takes Famvir. Also had some redness and scale of beard area lately).  The following portions of the chart were reviewed this encounter and updated as appropriate:  Tobacco  Allergies  Meds  Problems  Med Hx  Surg Hx  Fam Hx     Review of Systems:  No other skin or systemic complaints except as noted in HPI or Assessment and Plan.  Objective  Well appearing patient in no apparent distress; mood and affect are within normal limits.  A focused examination was performed including face. Relevant physical exam findings are noted in the Assessment and Plan.  Objective  left of midline sup forehead: Scaly pink patch  Objective  Left ear concha post crease at junction of concha and mid antihelix x 1, left mid brow x 1, face x 9 (11): Scaly pink papule of left of midline sup forehead.  Healing biopsy site of left ear concha post srease at junction of concha and mid antihelix.  Objective  Left Lower Vermilion Lip : Crust  Objective  Head - Anterior (Face): Pinkness and scale   Assessment & Plan  Neoplasm of uncertain behavior of skin left of midline sup forehead  Skin / nail biopsy Type of biopsy: tangential   Informed consent: discussed and consent obtained   Timeout: patient name, date of birth, surgical site, and procedure verified   Procedure prep:  Patient was prepped and draped in usual sterile fashion Prep type:  Isopropyl alcohol Anesthesia: the lesion was anesthetized in a standard fashion   Anesthetic:  1% lidocaine w/ epinephrine 1-100,000 buffered w/ 8.4% NaHCO3 Instrument used:  flexible razor blade   Hemostasis achieved with: pressure, aluminum chloride and electrodesiccation   Outcome: patient tolerated procedure well   Post-procedure details: sterile dressing applied and wound care instructions given   Dressing type: bandage and petrolatum    Specimen 1 - Surgical pathology Differential Diagnosis: AK vs BCC vs other  Check Margins: No Scaly pink patch  AK (actinic keratosis) (11) Left ear concha post crease at junction of concha and mid antihelix x 1, left mid brow x 1, face x 9  Biopsy proven AK of left ear concha post crease at junction of concha and mid antihelix - Ln2 today and may plan topical field treatment in the future. 1. Neoplasm of uncertain behavior of skin left of midline sup forehead  Skin / nail biopsy  Specimen 1 - Surgical pathology Differential Diagnosis: AK vs BCC vs other  Check Margins: No Scaly pink patch  2. AK (actinic keratosis) (11) Left ear concha post crease at junction of concha and mid antihelix x 1, left mid brow x 1, face x 9  Biopsy proven AK of left ear concha post crease at junction of concha and mid antihelix - Ln2 today and may plan topical field treatment in the future.  Destruction of lesion - Left ear concha post crease at junction of concha and mid antihelix x 1, left mid brow x 1, face x 9 Neoplasm of uncertain behavior of skin left of midline sup forehead  Skin / nail biopsy  Type of biopsy: tangential   Informed consent: discussed and consent obtained   Timeout: patient name, date of birth, surgical site, and procedure verified   Procedure prep:  Patient was prepped and draped in usual sterile fashion Prep type:  Isopropyl alcohol Anesthesia: the lesion was anesthetized in a standard fashion   Anesthetic:  1% lidocaine w/ epinephrine 1-100,000 buffered w/ 8.4% NaHCO3 Instrument used: flexible razor blade   Hemostasis achieved with: pressure, aluminum chloride and electrodesiccation   Outcome: patient  tolerated procedure well   Post-procedure details: sterile dressing applied and wound care instructions given   Dressing type: bandage and petrolatum    Specimen 1 - Surgical pathology Differential Diagnosis: AK vs BCC vs other  Check Margins: No Scaly pink patch  AK (actinic keratosis) (11) Left ear concha post crease at junction of concha and mid antihelix x 1, left mid brow x 1, face x 9  Biopsy proven AK of left ear concha post crease at junction of concha and mid antihelix - Ln2 today and may plan topical field treatment in the future.  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  - Call for new or changing lesions.  Return in about 6 weeks (around 02/24/2020).   I, Ashok Cordia, CMA, am acting as scribe for Sarina Ser, MD .  Documentation: I have reviewed the above documentation for accuracy and completeness, and I agree with the above.  Sarina Ser, MD

## 2020-01-13 NOTE — Patient Instructions (Signed)

## 2020-01-19 ENCOUNTER — Telehealth: Payer: Self-pay

## 2020-01-19 NOTE — Telephone Encounter (Signed)
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

## 2020-01-19 NOTE — Telephone Encounter (Signed)
Discussed biopsy results with pt  °

## 2020-02-09 ENCOUNTER — Telehealth: Payer: Self-pay

## 2020-02-09 NOTE — Telephone Encounter (Signed)
Left patient message to call. A catheter supply script was sent for signature. According to mychart message patient was transferring care. Patient does have a cysto scheduled for 10-20 unclear if patient wishes to keep care here and called to clarify

## 2020-02-09 NOTE — Telephone Encounter (Signed)
Pt. Returned call to state he will be keeping his appointment next week with Dr. Erlene Quan on 02/17/20 and may decide after that appointment if he will continue care here at [facility] or go somewhere else.

## 2020-02-16 NOTE — Progress Notes (Signed)
   02/17/2020  CC:  Chief Complaint  Patient presents with  . Cysto    HPI: Larry Shaffer is a 79 y.o. male who returns for a cystoscopy.   Urodynamics was performed at [facility] urology on 11/17/2018.  This indicated fairly significant sensory impairment with for sensation of 1600 mL's.  Max capacity was 1843 mL's.  There is loss of compliance at the end filling pressures of 15 cm of water.  He never had a desire to void.  There is no overactivity or bladder instability appreciated.  Pressure flow study was difficult to interpret as he is unable to void significant amounts (16 mL's at 2 mL's per second).  Max detrusor pressure was 30 to 34 cm of water.  PVR 1817.  Mild trabeculation was noted.  No reflux.  He continue to have  no urgency or desire to void.  He was not voiding spontaneously at all.  Patient has no recent upper tract imaging.   Patient is drinking plenty of water. He is changing his cath x 4-5 times per day ranging 200 to 300 cc. He has no urgency or desire to void. He has nocturia. He has cut back on fluids. If he waits to cath over night he will have 900 cc. He reports some trouble with inserting cath. He is very frustrated with his lifestyle.   He reports being treated for infection but he was asymptomatic.   Patient has back pain and reports having lumbar surgery back in 11/2018. He is following up with a general surgeon for abdominal hernia.    In the interim, he sought second opinion from Dr. Maudie Mercury in [facility], agrees with bladder management for neurogenic bladder.    Blood pressure (!) 157/75, pulse 82, height 5\' 9"  (1.753 m), weight 230 lb (104.3 kg). NED. A&Ox3.   No respiratory distress   Abd soft, NT, ND Normal phallus with bilateral descended testicles  Cystoscopy Procedure Note  Patient identification was confirmed, informed consent was obtained, and patient was prepped using Betadine solution.  Lidocaine jelly was administered per urethral meatus.      Pre-Procedure: - Inspection reveals a normal caliber ureteral meatus.  Procedure: The flexible cystoscope was introduced without difficulty - No urethral strictures/lesions are present. - Large capacity bladder - Mild bulbar stricture  - Enlarged prostate  - Normal bladder neck - Bilateral ureteral orifices identified - Bladder mucosa  reveals no ulcers, tumors, or lesions - No bladder stones - No trabeculation  Retroflexion shows no abnormalities.    Post-Procedure: - Patient tolerated the procedure well  Assessment/ Plan:  1. Neurogenic bladder  Managed by CIC Patient has no recent upper tract imaging.  Encouraged to keep volumes under 500 cc, discussed strategies overnight including fluid management and shifting of nighttime cath. Patient understands.  RUS today will call with results.   2. Chronic bacterial colonization Recently treated for infection even though he was asymptomatic Patient was educated on chronic colonization.  Advise patient to seek treatment only with true infections.   F/u in 1 year  I, Selena Batten, am acting as a scribe for Dr. Hollice Espy.  I have reviewed the above documentation for accuracy and completeness, and I agree with the above.   Hollice Espy, MD

## 2020-02-17 ENCOUNTER — Ambulatory Visit (INDEPENDENT_AMBULATORY_CARE_PROVIDER_SITE_OTHER): Payer: Medicare Other | Admitting: Urology

## 2020-02-17 ENCOUNTER — Other Ambulatory Visit: Payer: Self-pay

## 2020-02-17 ENCOUNTER — Encounter: Payer: Self-pay | Admitting: Urology

## 2020-02-17 VITALS — BP 157/75 | HR 82 | Ht 69.0 in | Wt 230.0 lb

## 2020-02-17 DIAGNOSIS — N319 Neuromuscular dysfunction of bladder, unspecified: Secondary | ICD-10-CM

## 2020-02-18 LAB — MICROSCOPIC EXAMINATION: Bacteria, UA: NONE SEEN

## 2020-02-18 LAB — URINALYSIS, COMPLETE
Bilirubin, UA: NEGATIVE
Glucose, UA: NEGATIVE
Leukocytes,UA: NEGATIVE
Nitrite, UA: NEGATIVE
RBC, UA: NEGATIVE
Specific Gravity, UA: >1.03 — ABNORMAL HIGH (ref 1.005–1.030)
Urobilinogen, Ur: 0.2 mg/dL (ref 0.2–1.0)
pH, UA: 5 (ref 5.0–7.5)

## 2020-02-24 ENCOUNTER — Ambulatory Visit
Admission: RE | Admit: 2020-02-24 | Discharge: 2020-02-24 | Disposition: A | Discharge status: Home / Self Care | Payer: Medicare Other | Source: Ambulatory Visit | Attending: Urology | Admitting: Urology

## 2020-02-24 ENCOUNTER — Other Ambulatory Visit: Payer: Self-pay

## 2020-02-24 DIAGNOSIS — N319 Neuromuscular dysfunction of bladder, unspecified: Secondary | ICD-10-CM | POA: Diagnosis present

## 2020-02-25 ENCOUNTER — Other Ambulatory Visit: Payer: Self-pay

## 2020-02-25 ENCOUNTER — Ambulatory Visit (INDEPENDENT_AMBULATORY_CARE_PROVIDER_SITE_OTHER): Payer: Medicare Other | Admitting: Dermatology

## 2020-02-25 DIAGNOSIS — L578 Other skin changes due to chronic exposure to nonionizing radiation: Secondary | ICD-10-CM | POA: Diagnosis not present

## 2020-02-25 DIAGNOSIS — L570 Actinic keratosis: Secondary | ICD-10-CM

## 2020-02-25 NOTE — Progress Notes (Signed)
   Follow-Up Visit   Subjective  Larry Shaffer is a 79 y.o. male who presents for the following: Actinic Keratosis (follow up - LN2 x 11) and Follow-up. He has other areas to be evaluated today.  He has history of significant sun exposure and skin cancer and precancers treated in the past.  The following portions of the chart were reviewed this encounter and updated as appropriate:  Tobacco  Allergies  Meds  Problems  Med Hx  Surg Hx  Fam Hx     Review of Systems:  No other skin or systemic complaints except as noted in HPI or Assessment and Plan.  Objective  Well appearing patient in no apparent distress; mood and affect are within normal limits.  A focused examination was performed including face, scalp. Relevant physical exam findings are noted in the Assessment and Plan.  Objective  Left of midline sup forehead, Right Temple x 1, right ear x 1 (2): Well healed biopsy site of left of midline sup forehead.  Erythematous thin papules/macules with gritty scale.    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  Destruction of lesion - Left of midline sup forehead, Right Temple x 1, right ear x 1 Complexity: simple   Destruction method: cryotherapy   Informed consent: discussed and consent obtained   Timeout:  patient name, date of birth, surgical site, and procedure verified Lesion destroyed using liquid nitrogen: Yes   Region frozen until ice ball extended beyond lesion: Yes   Outcome: patient tolerated procedure well with no complications   Post-procedure details: wound care instructions given    Actinic Damage - diffuse scaly erythematous macules with underlying dyspigmentation - Recommend daily broad spectrum sunscreen SPF 30+ to sun-exposed areas, reapply every 2 hours as needed.  - Call for new or changing lesions.  Return  for AK follow up 4-6 months.  I, Ashok Cordia, CMA, am acting as scribe for Sarina Ser, MD .  Documentation: I have reviewed the above documentation for accuracy and completeness, and I agree with the above.  Sarina Ser, MD

## 2020-02-25 NOTE — Patient Instructions (Signed)

## 2020-02-26 ENCOUNTER — Encounter: Payer: Self-pay | Admitting: Dermatology

## 2020-03-01 ENCOUNTER — Telehealth: Payer: Self-pay

## 2020-03-01 NOTE — Telephone Encounter (Signed)
Pt aware. Patient is curious if this continues to grow what he would need to do.

## 2020-03-01 NOTE — Telephone Encounter (Signed)
Nothing more than likely.  These almost never cause an issue.  Hollice Espy, MD

## 2020-03-01 NOTE — Telephone Encounter (Signed)
-----   Message from Hollice Espy, MD sent at 03/01/2020 10:36 AM EDT ----- Renal ultrasound looks fine.  Small benign cyst on left kidney, otherwise unremarkable.    Hollice Espy, MD

## 2020-06-06 IMAGING — RF LUMBAR SPINE - 2-3 VIEW
1 series · 2 of 2 positions shown · non-contrast
Comparison: Lumbar MRI 01/26/2017

CLINICAL DATA: Intraoperative localization

EXAM:
LUMBAR SPINE - 2-3 VIEW; DG C-ARM 1-60 MIN

[Series 1: unknown protocol · 0.14mm/px · 2 of 2 slices shown]
[im 1/2]
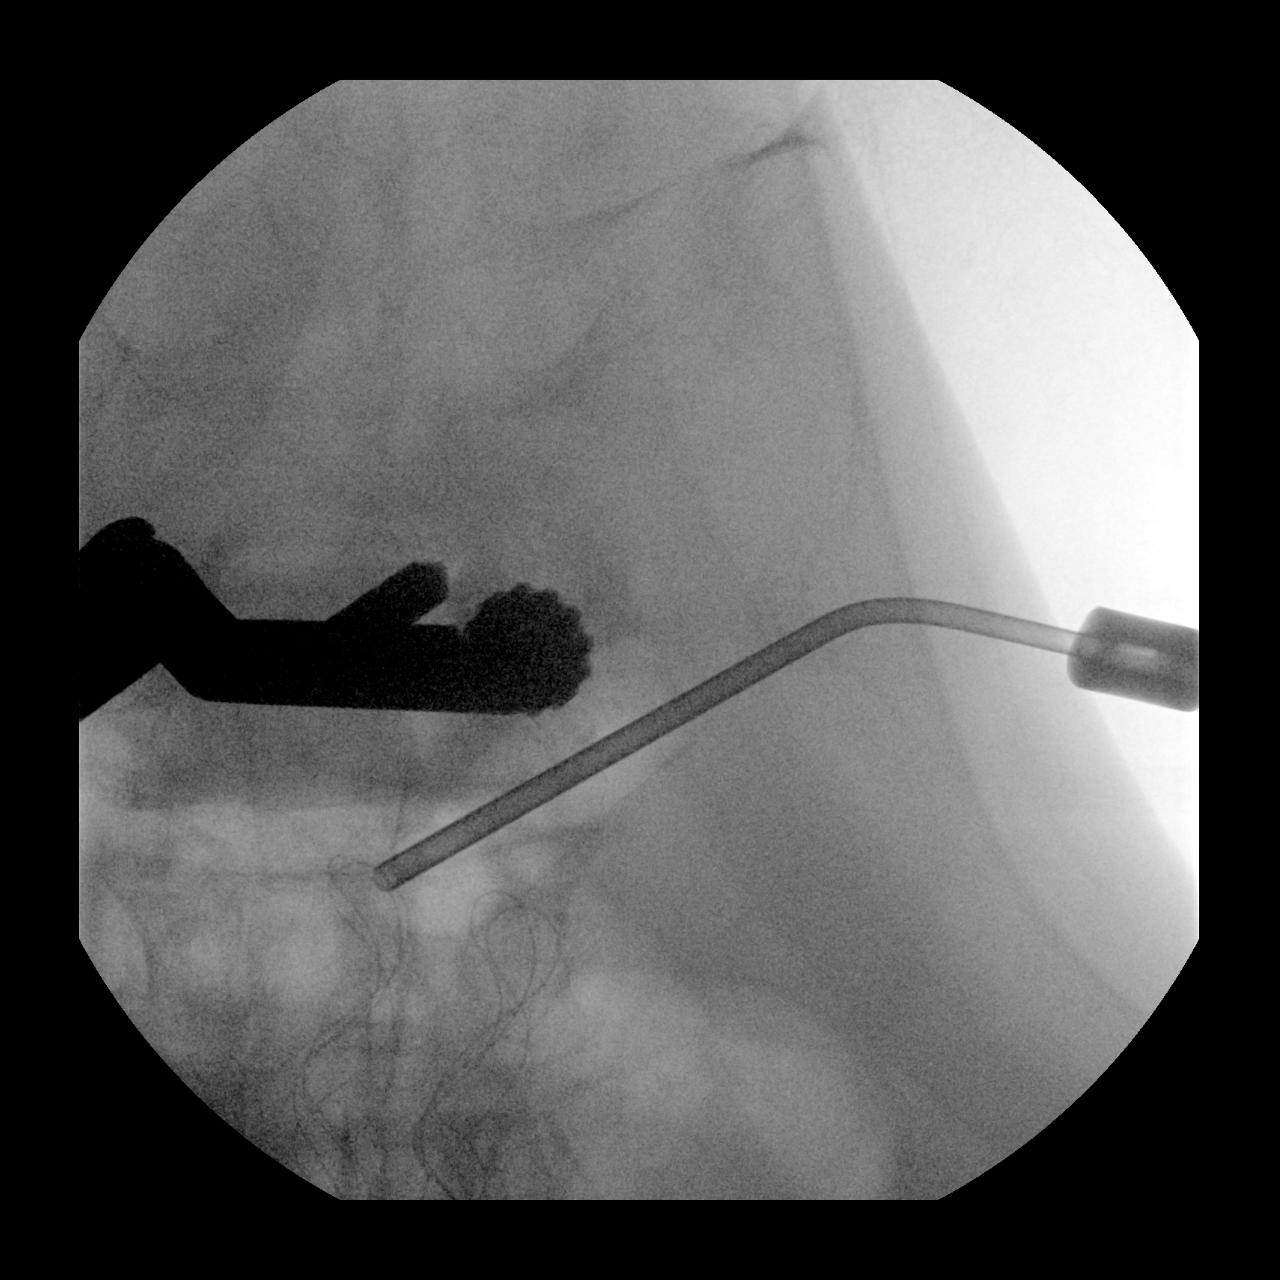
[im 2/2]
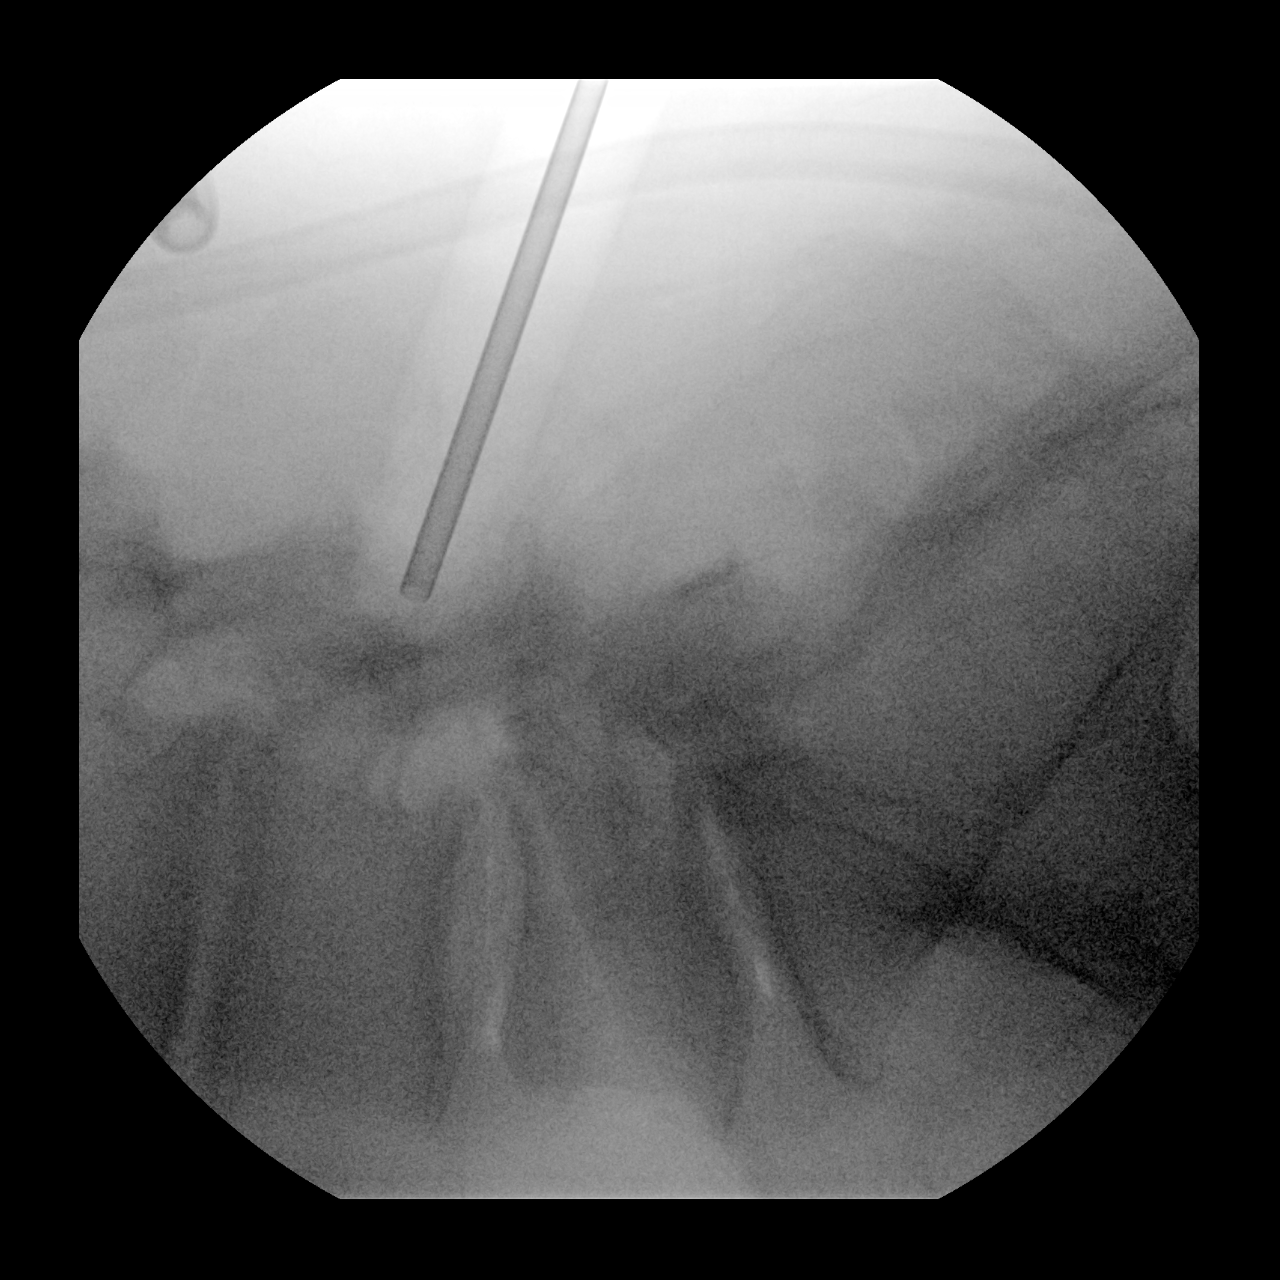

[2 of 2 positions shown; findings below may reference images not displayed]

FINDINGS: Fluoroscopic radiograph demonstrates positioning of a surgical
implement at the lamina of L4. positioned centrally on the frontal
radiograph. A pair of laparotomy pad markers project within the
field on the frontal fluoroscopic view as well. Redemonstration of
the discogenic and facet degenerative changes, better assessed on
prior MR
IMPRESSION: Intraoperative localization, as above.

## 2020-06-06 IMAGING — RF DG C-ARM 1-60 MIN
1 series · 2 of 2 positions shown · non-contrast
Comparison: Lumbar MRI 01/26/2017

CLINICAL DATA: Intraoperative localization

EXAM:
LUMBAR SPINE - 2-3 VIEW; DG C-ARM 1-60 MIN

[Series 1: unknown protocol · 0.14mm/px · 2 of 2 slices shown]
[im 1/2]
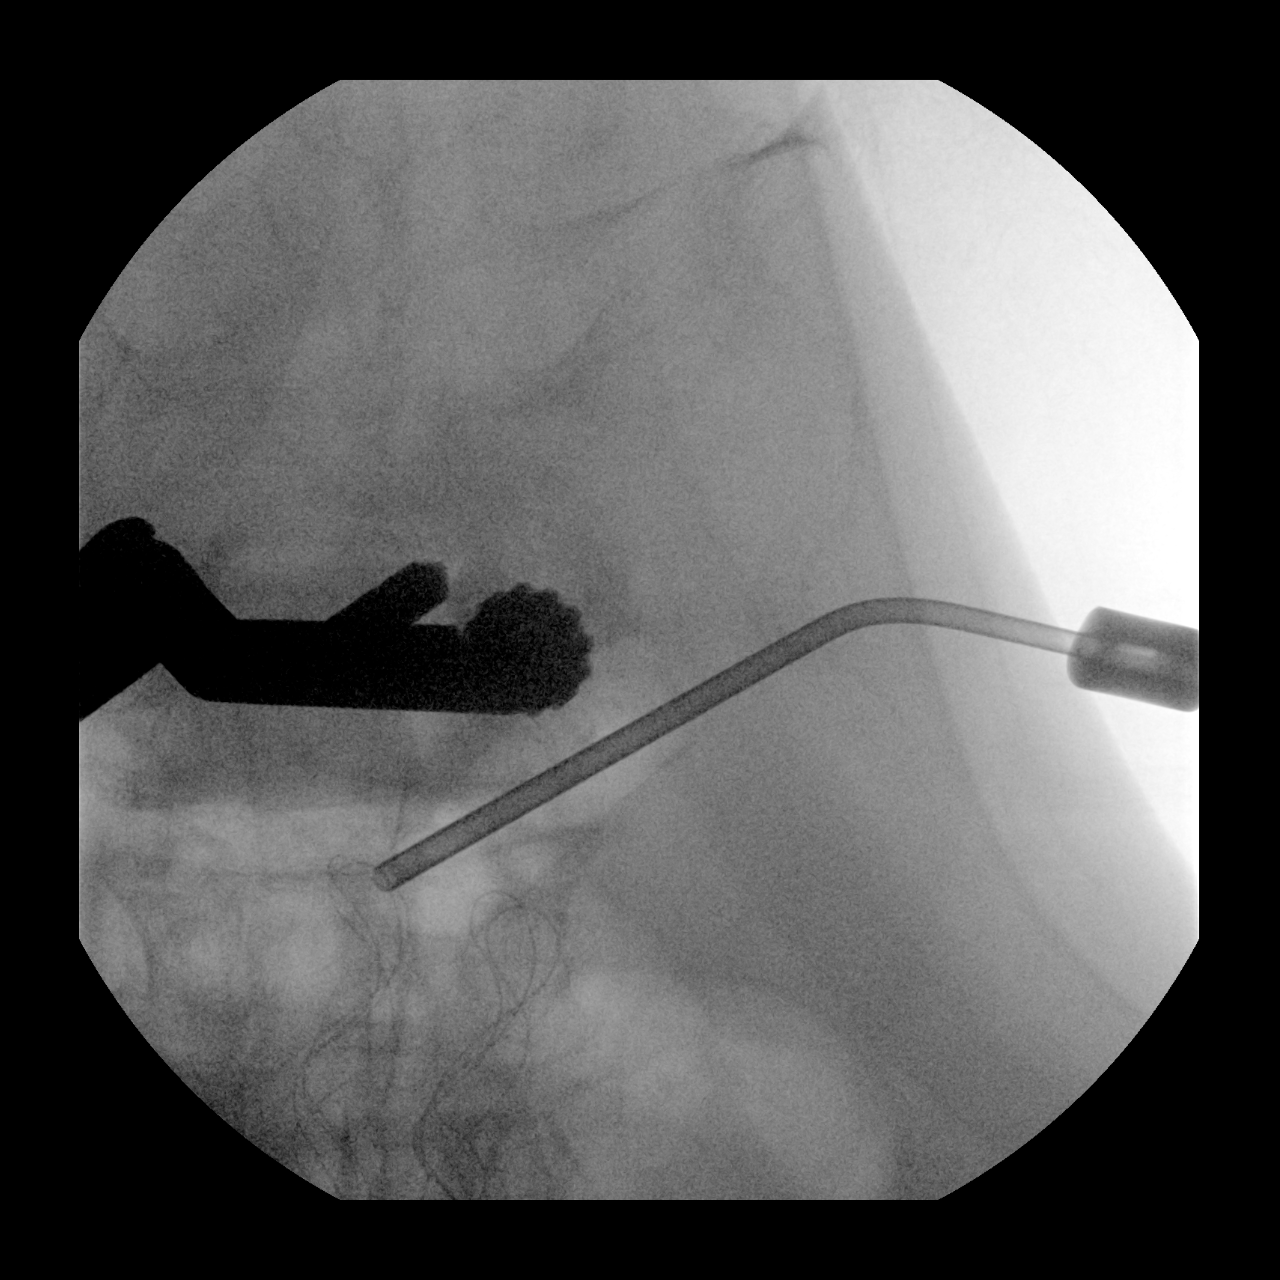
[im 2/2]
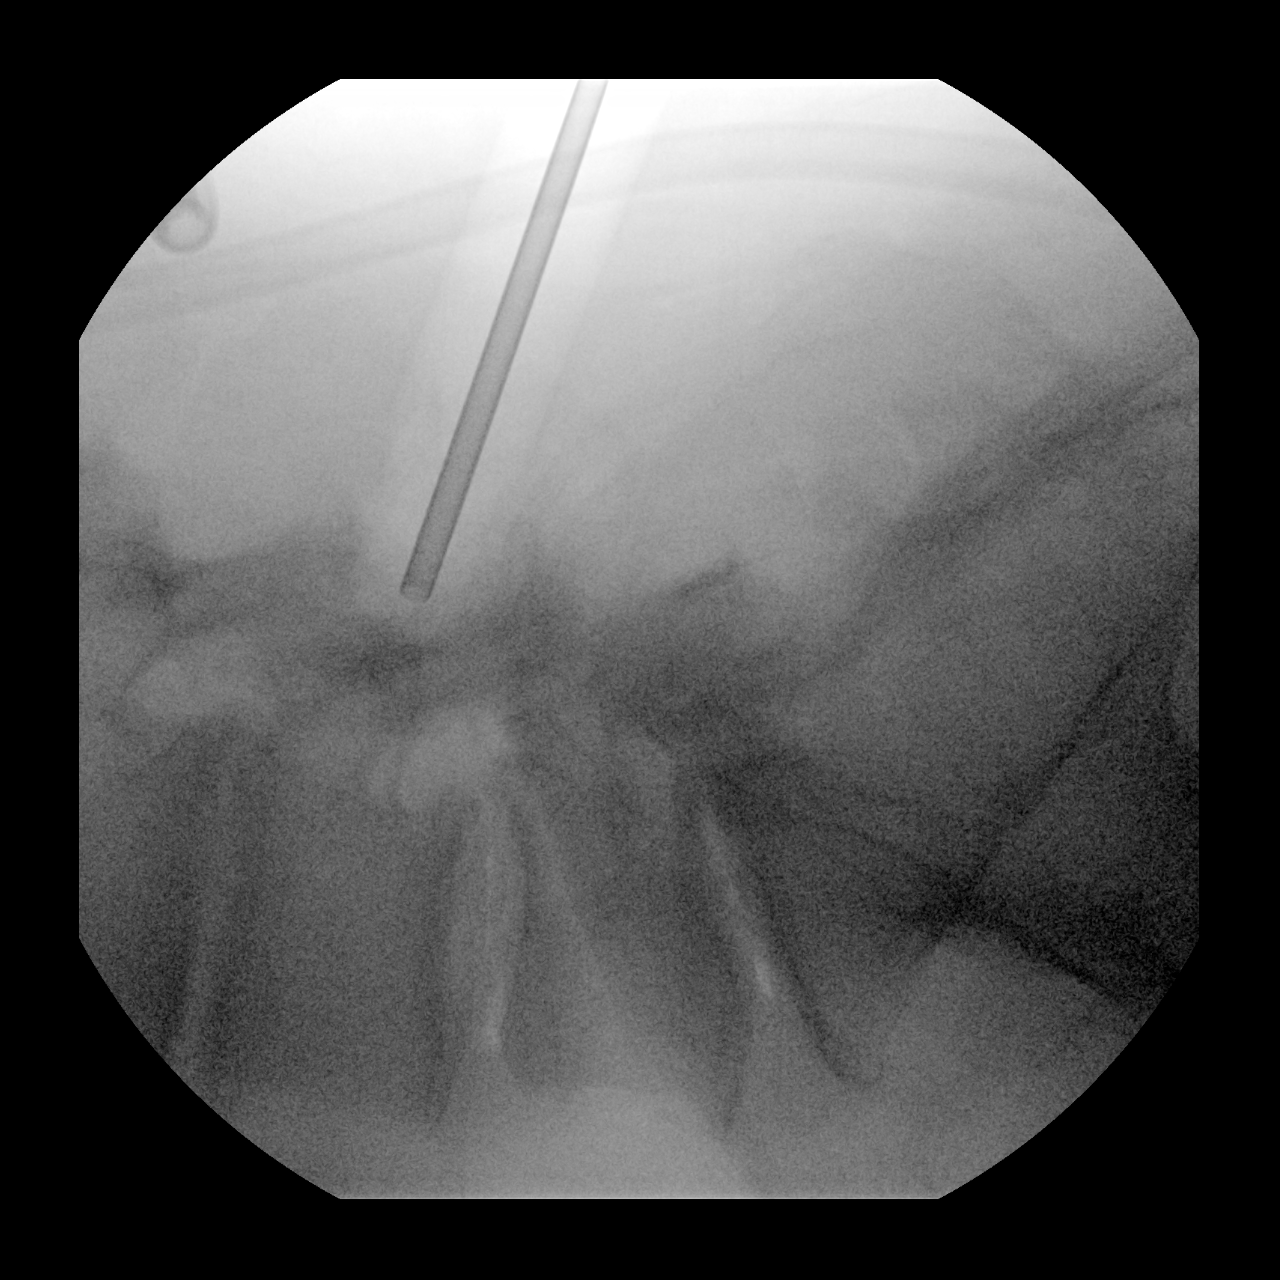

[2 of 2 positions shown; findings below may reference images not displayed]

FINDINGS: Fluoroscopic radiograph demonstrates positioning of a surgical
implement at the lamina of L4. positioned centrally on the frontal
radiograph. A pair of laparotomy pad markers project within the
field on the frontal fluoroscopic view as well. Redemonstration of
the discogenic and facet degenerative changes, better assessed on
prior MR
IMPRESSION: Intraoperative localization, as above.

## 2020-06-06 IMAGING — RF DG C-ARM 1-60 MIN
1 series · 9 of 9 positions shown · non-contrast
Comparison: Lumbar MRI 01/26/2017

CLINICAL DATA: Intraoperative localization

EXAM:
LUMBAR SPINE - 2-3 VIEW; DG C-ARM 1-60 MIN

[Series 1: unknown protocol · 0.14mm/px · 9 of 9 slices shown]
[im 1/9]
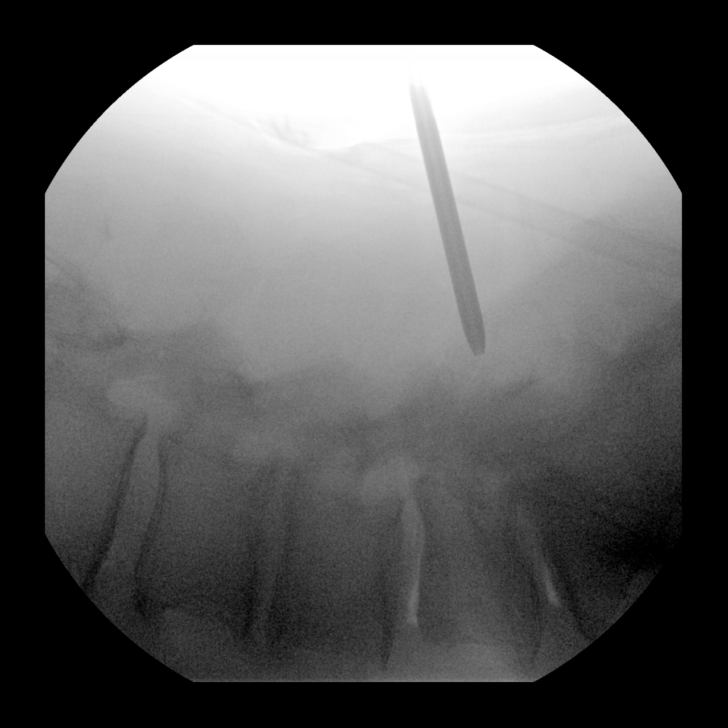
[im 2/9]
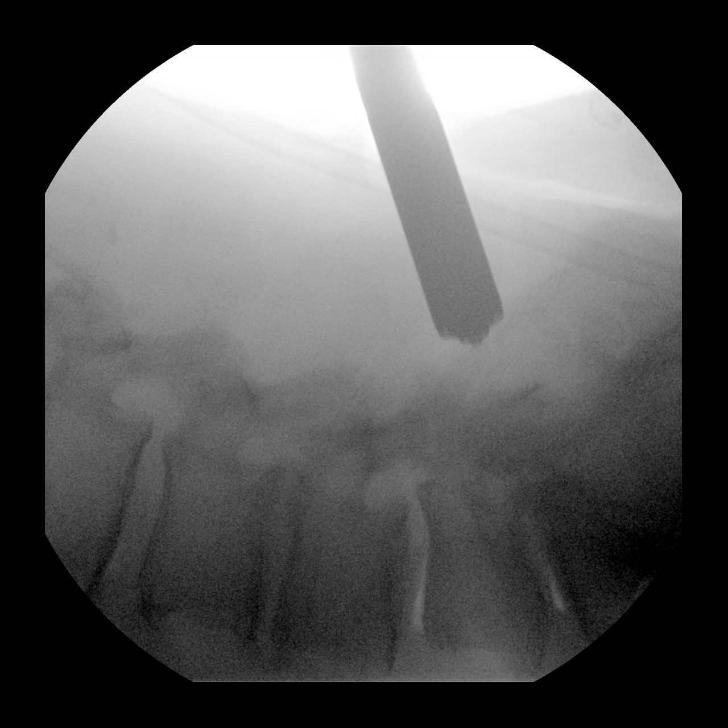
[im 3/9]
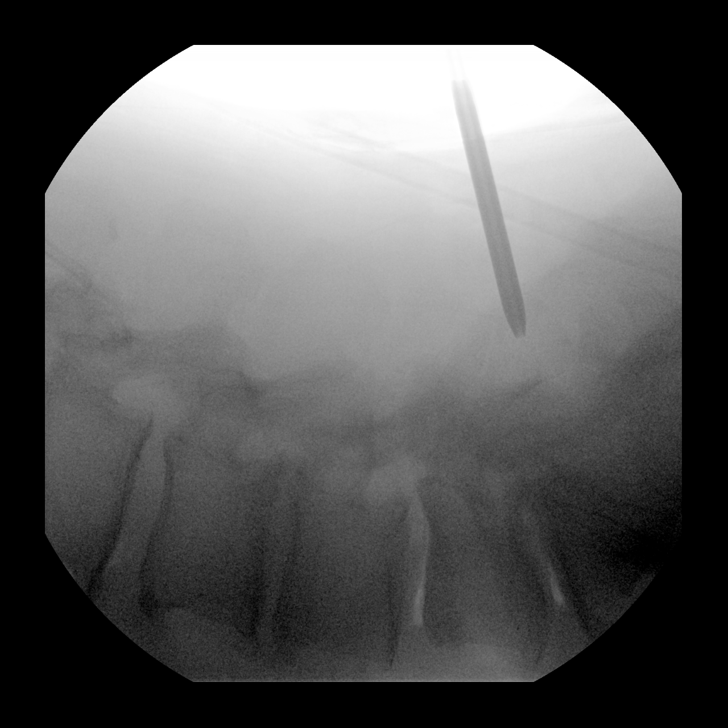
[im 4/9]
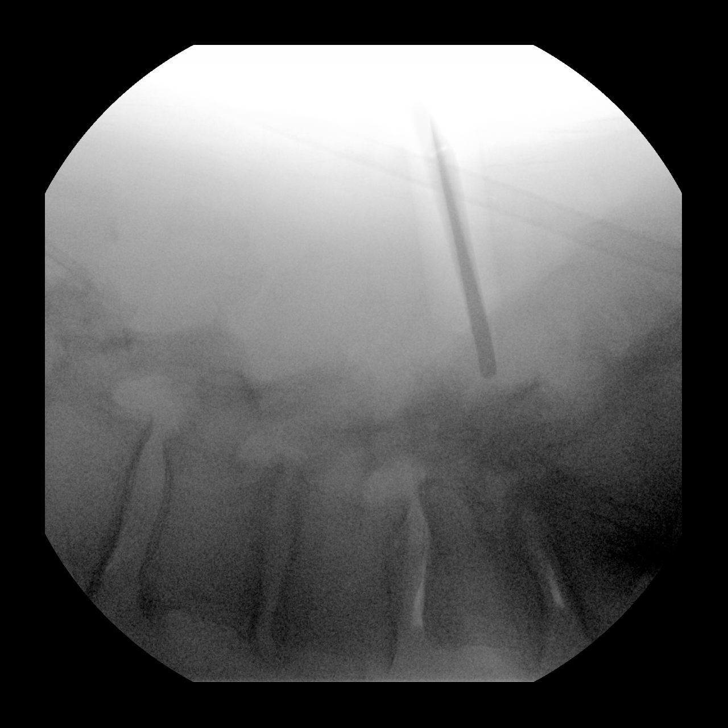
[im 5/9]
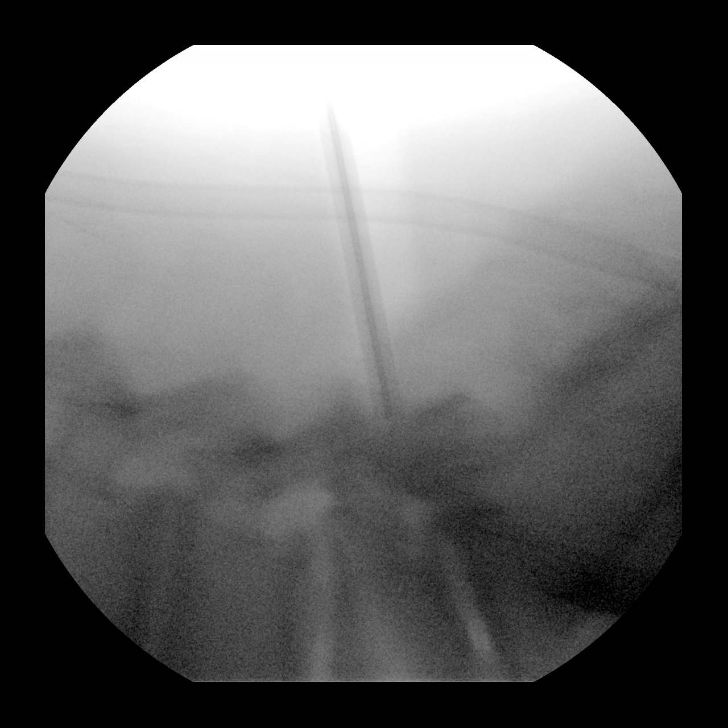
[im 6/9]
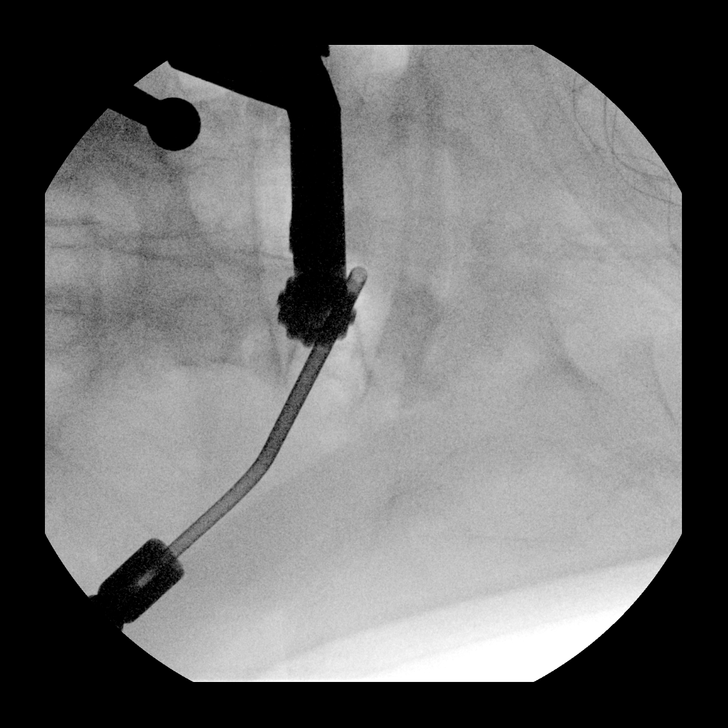
[im 7/9]
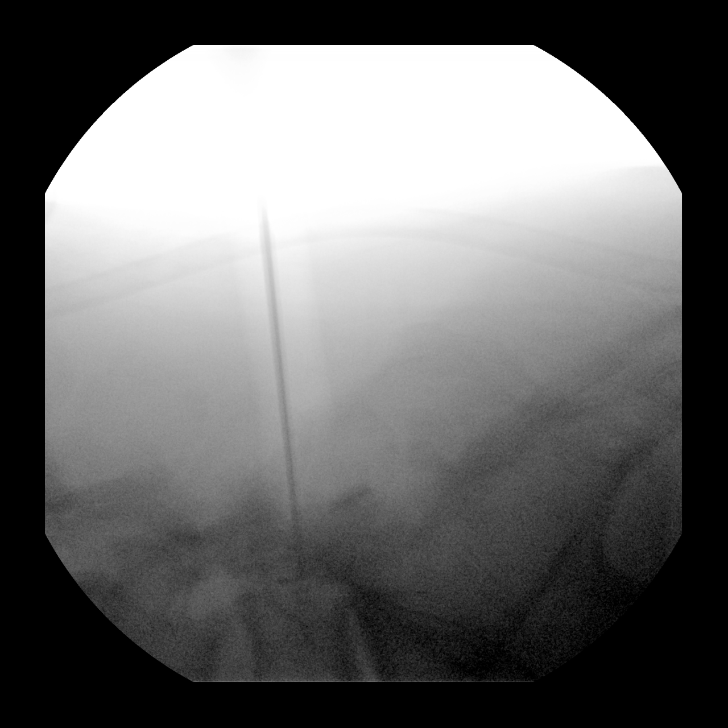
[im 8/9]
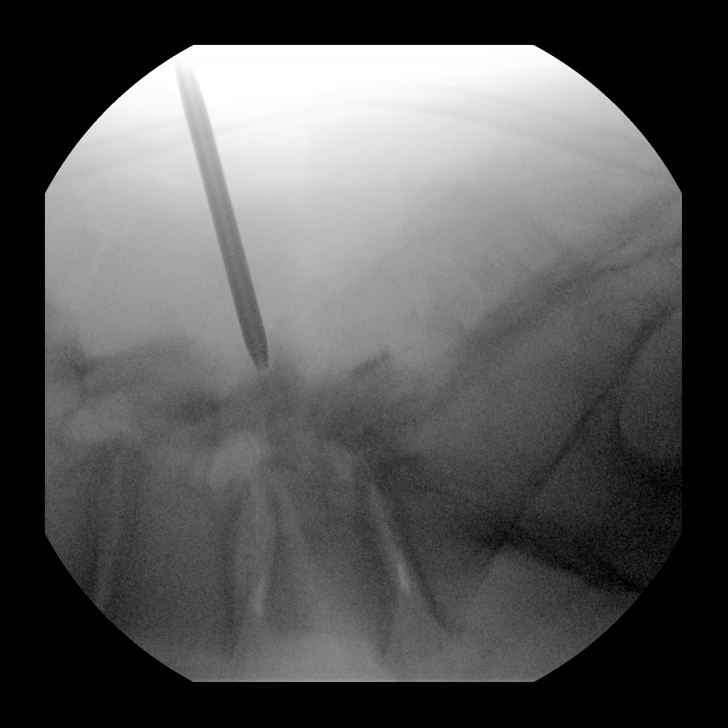
[im 9/9]
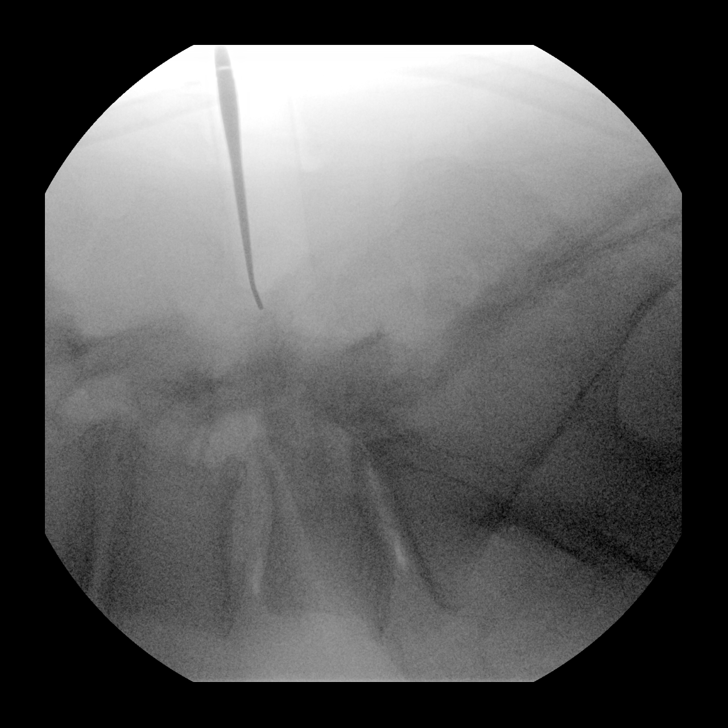

[9 of 9 positions shown; findings below may reference images not displayed]

FINDINGS: Fluoroscopic radiograph demonstrates positioning of a surgical
implement at the lamina of L4. positioned centrally on the frontal
radiograph. A pair of laparotomy pad markers project within the
field on the frontal fluoroscopic view as well. Redemonstration of
the discogenic and facet degenerative changes, better assessed on
prior MR
IMPRESSION: Intraoperative localization, as above.

## 2020-08-10 ENCOUNTER — Other Ambulatory Visit: Payer: Self-pay

## 2020-08-10 ENCOUNTER — Ambulatory Visit (INDEPENDENT_AMBULATORY_CARE_PROVIDER_SITE_OTHER): Payer: Medicare Other | Admitting: Dermatology

## 2020-08-10 ENCOUNTER — Encounter: Payer: Self-pay | Admitting: Dermatology

## 2020-08-10 DIAGNOSIS — D485 Neoplasm of uncertain behavior of skin: Secondary | ICD-10-CM

## 2020-08-10 DIAGNOSIS — L570 Actinic keratosis: Secondary | ICD-10-CM | POA: Diagnosis not present

## 2020-08-10 DIAGNOSIS — L578 Other skin changes due to chronic exposure to nonionizing radiation: Secondary | ICD-10-CM | POA: Diagnosis not present

## 2020-08-10 DIAGNOSIS — D0422 Carcinoma in situ of skin of left ear and external auricular canal: Secondary | ICD-10-CM | POA: Diagnosis not present

## 2020-08-10 NOTE — Patient Instructions (Addendum)
If you have any questions or concerns for your doctor, please call our main line at [phone] and press option 4 to reach your doctor's medical assistant. If no one answers, please leave a voicemail as directed and we will return your call as soon as possible. Messages left after 4 pm will be answered the following business day.   You may also send us a message via MyChart. We typically respond to MyChart messages within 1-2 business days.  For prescription refills, please ask your pharmacy to contact our office. Our fax number is [phone].  If you have an urgent issue when the clinic is closed that cannot wait until the next business day, you can page your doctor at the number below.    Please note that while we do our best to be available for urgent issues outside of office hours, we are not available 24/7.   If you have an urgent issue and are unable to reach us, you may choose to seek medical care at your doctor's office, retail clinic, urgent care center, or emergency room.  If you have a medical emergency, please immediately call 911 or go to the emergency department.  Pager Numbers  - Dr. Kowalski: [phone]  - Dr. Moye: [phone]  - Dr. Stewart: [phone]  In the event of inclement weather, please call our main line at [phone] for an update on the status of any delays or closures.  Dermatology Medication Tips: Please keep the boxes that topical medications come in in order to help keep track of the instructions about where and how to use these. Pharmacies typically print the medication instructions only on the boxes and not directly on the medication tubes.   If your medication is too expensive, please contact our office at [phone] option 4 or send us a message through MyChart.   We are unable to tell what your co-pay for medications will be in advance as this is different depending on your insurance coverage. However, we may be able to find a substitute  medication at lower cost or fill out paperwork to get insurance to cover a needed medication.   If a prior authorization is required to get your medication covered by your insurance company, please allow us 1-2 business days to complete this process.  Drug prices often vary depending on where the prescription is filled and some pharmacies may offer cheaper prices.  The website www.goodrx.com contains coupons for medications through different pharmacies. The prices here do not account for what the cost may be with help from insurance (it may be cheaper with your insurance), but the website can give you the price if you did not use any insurance.  - You can print the associated coupon and take it with your prescription to the pharmacy.  - You may also stop by our office during regular business hours and pick up a GoodRx coupon card.  - If you need your prescription sent electronically to a different pharmacy, notify our office through [facility] MyChart or by phone at [phone] option 4.  Instructions for [facility] Medications  One or more of your medications was sent to the [facility] mail order compounding pharmacy. You will receive an email from them and can purchase the medicine through that link. It will then be mailed to your home at the address you confirmed. If for any reason you do not receive an email from them, please check your spam folder. If you still do not find the email,   please let us know. [facility] phone number is [phone].   

## 2020-08-10 NOTE — Progress Notes (Signed)
Follow-Up Visit   Subjective  Larry Shaffer is a 80 y.o. male who presents for the following: Actinic Keratosis (Recheck for new or persistent skin lesions on the face, scalp, and ears).  The following portions of the chart were reviewed this encounter and updated as appropriate:   Tobacco  Allergies  Meds  Problems  Med Hx  Surg Hx  Fam Hx     Review of Systems:  No other skin or systemic complaints except as noted in HPI or Assessment and Plan.  Objective  Well appearing patient in no apparent distress; mood and affect are within normal limits.  A focused examination was performed including the face, scalp, and ears. Relevant physical exam findings are noted in the Assessment and Plan.  Objective  Face and scalp x 20 (20): Erythematous thin papules/macules with gritty scale.   Objective  L ear sup helix: Crust 0.5 cm      Assessment & Plan  AK (actinic keratosis) (20) Face and scalp x 20  In one months start [facility] 5FU/Calcipotriene mix BID x 7 days. (= FIELD TREATMENT - SEE BELOW)  Destruction of lesion - Face and scalp x 20 Complexity: simple   Destruction method: cryotherapy   Informed consent: discussed and consent obtained   Timeout:  patient name, date of birth, surgical site, and procedure verified Lesion destroyed using liquid nitrogen: Yes   Region frozen until ice ball extended beyond lesion: Yes   Outcome: patient tolerated procedure well with no complications   Post-procedure details: wound care instructions given    Neoplasm of uncertain behavior of skin L ear sup helix  Skin / nail biopsy Type of biopsy: tangential   Informed consent: discussed and consent obtained   Timeout: patient name, date of birth, surgical site, and procedure verified   Procedure prep:  Patient was prepped and draped in usual sterile fashion Prep type:  Isopropyl alcohol Anesthesia: the lesion was anesthetized in a standard fashion   Anesthetic:  1%  lidocaine w/ epinephrine 1-100,000 buffered w/ 8.4% NaHCO3 Instrument used: flexible razor blade   Hemostasis achieved with: pressure, aluminum chloride and electrodesiccation   Outcome: patient tolerated procedure well   Post-procedure details: sterile dressing applied and wound care instructions given   Dressing type: bandage and petrolatum    Specimen 1 - Surgical pathology Differential Diagnosis: D48.5 ruptured cyst r/o CA  Check Margins: No Crust 0.5 cm   Actinic Damage - Severe, confluent actinic changes with pre-cancerous actinic keratoses  - Severe, chronic, not at goal, secondary to cumulative UV radiation exposure over time - diffuse scaly erythematous macules and papules with underlying dyspigmentation - Discussed Prescription "Field Treatment" for Severe, Chronic Confluent Actinic Changes with Pre-Cancerous Actinic Keratoses Field treatment involves treatment of an entire area of skin that has confluent Actinic Changes (Sun/ Ultraviolet light damage) and PreCancerous Actinic Keratoses by method of PhotoDynamic Therapy (PDT) and/or prescription Topical Chemotherapy agents such as 5-fluorouracil, 5-fluorouracil/calcipotriene, and/or imiquimod.  The purpose is to decrease the number of clinically evident and subclinical PreCancerous lesions to prevent progression to development of skin cancer by chemically destroying early precancer changes that may or may not be visible.  It has been shown to reduce the risk of developing skin cancer in the treated area. As a result of treatment, redness, scaling, crusting, and open sores may occur during treatment course. One or more than one of these methods may be used and may have to be used several times to control, suppress  and eliminate the PreCancerous changes. Discussed treatment course, expected reaction, and possible side effects. - Recommend daily broad spectrum sunscreen SPF 30+ to sun-exposed areas, reapply every 2 hours as needed.  -  Staying in the shade or wearing long sleeves, sun glasses (UVA+UVB protection) and wide brim hats (4-inch brim around the entire circumference of the hat) are also recommended. - Call for new or changing lesions.  Return in about 6 months (around 02/09/2021) for AK follow up .  Luther Redo, CMA, am acting as scribe for Sarina Ser, MD .  Documentation: I have reviewed the above documentation for accuracy and completeness, and I agree with the above.  Sarina Ser, MD

## 2020-08-15 ENCOUNTER — Telehealth: Payer: Self-pay

## 2020-08-15 NOTE — Telephone Encounter (Signed)
Patient informed of pathology results, appointment scheduled.

## 2020-08-15 NOTE — Telephone Encounter (Signed)
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

## 2020-08-30 ENCOUNTER — Encounter: Payer: Self-pay | Admitting: Dermatology

## 2020-08-30 ENCOUNTER — Ambulatory Visit (INDEPENDENT_AMBULATORY_CARE_PROVIDER_SITE_OTHER): Payer: Medicare Other | Admitting: Dermatology

## 2020-08-30 ENCOUNTER — Other Ambulatory Visit: Payer: Self-pay

## 2020-08-30 DIAGNOSIS — D099 Carcinoma in situ, unspecified: Secondary | ICD-10-CM

## 2020-08-30 DIAGNOSIS — L820 Inflamed seborrheic keratosis: Secondary | ICD-10-CM

## 2020-08-30 DIAGNOSIS — L570 Actinic keratosis: Secondary | ICD-10-CM

## 2020-08-30 DIAGNOSIS — L578 Other skin changes due to chronic exposure to nonionizing radiation: Secondary | ICD-10-CM

## 2020-08-30 DIAGNOSIS — L918 Other hypertrophic disorders of the skin: Secondary | ICD-10-CM

## 2020-08-30 DIAGNOSIS — D0422 Carcinoma in situ of skin of left ear and external auricular canal: Secondary | ICD-10-CM | POA: Diagnosis not present

## 2020-08-30 NOTE — Progress Notes (Signed)
Follow-Up Visit   Subjective  Larry Shaffer is a 80 y.o. 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  He has several areas to be evaluated some are irritating and itchy.  The following portions of the chart were reviewed this encounter and updated as appropriate:   Tobacco  Allergies  Meds  Problems  Med Hx  Surg Hx  Fam Hx     Review of Systems:  No other skin or systemic complaints except as noted in HPI or Assessment and Plan.  Objective  Well appearing patient in no apparent distress; mood and affect are within normal limits.  A focused examination was performed including face, ears, neck. Relevant physical exam findings are noted in the Assessment and Plan.  Objective  Face (2): Erythematous thin papules/macules with gritty scale.   Objective  Left sup antihelix: Healing biopsy site  Objective  Right Anterior Neck: Fleshy, skin-colored pedunculated papules.    Objective  Right Anterior Neck: Erythematous keratotic or waxy stuck-on papule or plaque.    Assessment & Plan  AK (actinic keratosis) (2) Face  Restart 5 FU/Calcipotriene cream x 4 more days  Destruction of lesion - Face Complexity: simple   Destruction method: cryotherapy   Informed consent: discussed and consent obtained   Timeout:  patient name, date of birth, surgical site, and procedure verified Lesion destroyed using liquid nitrogen: Yes   Region frozen until ice ball extended beyond lesion: Yes   Outcome: patient tolerated procedure well with no complications   Post-procedure details: wound care instructions given    Actinic Damage - Severe, confluent actinic changes with pre-cancerous actinic keratoses  - Severe, chronic, not at goal, secondary to cumulative UV  radiation exposure over time - diffuse scaly erythematous macules and papules with underlying dyspigmentation - Discussed Prescription "Field Treatment" for Severe, Chronic Confluent Actinic Changes with Pre-Cancerous Actinic Keratoses Field treatment involves treatment of an entire area of skin that has confluent Actinic Changes (Sun/ Ultraviolet light damage) and PreCancerous Actinic Keratoses by method of PhotoDynamic Therapy (PDT) and/or prescription Topical Chemotherapy agents such as 5-fluorouracil, 5-fluorouracil/calcipotriene, and/or imiquimod.  The purpose is to decrease the number of clinically evident and subclinical PreCancerous lesions to prevent progression to development of skin cancer by chemically destroying early precancer changes that may or may not be visible.  It has been shown to reduce the risk of developing skin cancer in the treated area. As a result of treatment, redness, scaling, crusting, and open sores may occur during treatment course. One or more than one of these methods may be used and may have to be used several times to control, suppress and eliminate the PreCancerous changes. Discussed treatment course, expected reaction, and possible side effects. - Recommend daily broad spectrum sunscreen SPF 30+ to sun-exposed areas, reapply every 2 hours as needed.  - Staying in the shade or wearing long sleeves, sun glasses (UVA+UVB protection) and wide brim hats (4-inch brim around the entire circumference of the hat) are also recommended. - Call for new or changing lesions.  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 Destruction of lesion Complexity: extensive   Destruction method: electrodesiccation and curettage   Informed consent: discussed and consent obtained   Timeout:  patient name, date of birth, surgical  site, and procedure verified Procedure prep:  Patient was prepped and draped in usual sterile fashion Prep type:  Isopropyl alcohol Anesthesia: the lesion  was anesthetized in a standard fashion   Anesthetic:  1% lidocaine w/ epinephrine 1-100,000 buffered w/ 8.4% NaHCO3 Curettage performed in three different directions: Yes   Electrodesiccation performed over the curetted area: Yes   Final wound size (cm):  1.1 Hemostasis achieved with:  pressure and aluminum chloride Outcome: patient tolerated procedure well with no complications   Post-procedure details: sterile dressing applied and wound care instructions given   Dressing type: bandage and petrolatum    Skin tag Right Anterior Neck  Symptomatic  Epidermal / dermal shaving - Right Anterior Neck  Informed consent: discussed and consent obtained   Patient was prepped and draped in usual sterile fashion: Area prepped with alcohol. Anesthesia: the lesion was anesthetized in a standard fashion   Anesthetic:  1% lidocaine w/ epinephrine 1-100,000 buffered w/ 8.4% NaHCO3 Instrument used: scissors   Hemostasis achieved with: pressure, aluminum chloride and electrodesiccation   Outcome: patient tolerated procedure well   Post-procedure details: wound care instructions given    Inflamed seborrheic keratosis Right Anterior Neck  Destruction of lesion - Right Anterior Neck Complexity: simple   Destruction method: cryotherapy   Informed consent: discussed and consent obtained   Timeout:  patient name, date of birth, surgical site, and procedure verified Lesion destroyed using liquid nitrogen: Yes   Region frozen until ice ball extended beyond lesion: Yes   Outcome: patient tolerated procedure well with no complications   Post-procedure details: wound care instructions given    Actinic Damage - chronic, secondary to cumulative UV radiation exposure/sun exposure over time - diffuse scaly erythematous macules with underlying dyspigmentation - Recommend daily broad spectrum sunscreen SPF 30+ to sun-exposed areas, reapply every 2 hours as needed.  - Recommend staying in the shade or wearing  long sleeves, sun glasses (UVA+UVB protection) and wide brim hats (4-inch brim around the entire circumference of the hat). - Call for new or changing lesions.  Return for Follow up as scheduled.  I, Ashok Cordia, CMA, am acting as scribe for Sarina Ser, MD .  Documentation: I have reviewed the above documentation for accuracy and completeness, and I agree with the above.  Sarina Ser, MD

## 2020-08-30 NOTE — Patient Instructions (Signed)
Cryotherapy Aftercare  . Wash gently with soap and water everyday.   Marland Kitchen Apply Vaseline and Band-Aid daily until healed.  Wound Care Instructions  1. Cleanse wound gently with soap and water once a day then pat dry with clean gauze. Apply a thing coat of Petrolatum (petroleum jelly, "Vaseline") over the wound (unless you have an allergy to this). We recommend that you use a new, sterile tube of Vaseline. Do not pick or remove scabs. Do not remove the yellow or white "healing tissue" from the base of the wound.  2. Cover the wound with fresh, clean, nonstick gauze and secure with paper tape. You may use Band-Aids in place of gauze and tape if the would is small enough, but would recommend trimming much of the tape off as there is often too much. Sometimes Band-Aids can irritate the skin.  3. You should call the office for your biopsy report after 1 week if you have not already been contacted.  4. If you experience any problems, such as abnormal amounts of bleeding, swelling, significant bruising, significant pain, or evidence of infection, please call the office immediately.  5. FOR ADULT SURGERY PATIENTS: If you need something for pain relief you may take 1 extra strength Tylenol (acetaminophen) AND 2 Ibuprofen (200mg  each) together every 4 hours as needed for pain. (do not take these if you are allergic to them or if you have a reason you should not take them.) Typically, you may only need pain medication for 1 to 3 days.    If you have any questions or concerns for your doctor, please call our main line at [phone] and press option 4 to reach your doctor's medical assistant. If no one answers, please leave a voicemail as directed and we will return your call as soon as possible. Messages left after 4 pm will be answered the following business day.   You may also send [place] a message via [facility]. We typically respond to MyChart messages within 1-2 business days.  For prescription refills,  please ask your pharmacy to contact our office. Our fax number is [phone].  If you have an urgent issue when the clinic is closed that cannot wait until the next business day, you can page your doctor at the number below.    Please note that while we do our best to be available for urgent issues outside of office hours, we are not available 24/7.   If you have an urgent issue and are unable to reach [place], you may choose to seek medical care at your doctor's office, retail clinic, urgent care center, or emergency room.  If you have a medical emergency, please immediately call 911 or go to the emergency department.  Pager Numbers  - Dr. Nehemiah Massed: [phone]  - Dr. Laurence Ferrari: [phone]  - Dr. Nicole Kindred: [phone]  In the event of inclement weather, please call our main line at [phone] for an update on the status of any delays or closures.  Dermatology Medication Tips: Please keep the boxes that topical medications come in in order to help keep track of the instructions about where and how to use these. Pharmacies typically print the medication instructions only on the boxes and not directly on the medication tubes.   If your medication is too expensive, please contact our office at [phone] option 4 or send [place] a message through [place].   We are unable to tell what your co-pay for medications will be in advance as this is different depending  on your insurance coverage. However, we may be able to find a substitute medication at lower cost or fill out paperwork to get insurance to cover a needed medication.   If a prior authorization is required to get your medication covered by your insurance company, please allow [place] 1-2 business days to complete this process.  Drug prices often vary depending on where the prescription is filled and some pharmacies may offer cheaper prices.  The website www.goodrx.com contains coupons for medications through different pharmacies. The prices  here do not account for what the cost may be with help from insurance (it may be cheaper with your insurance), but the website can give you the price if you did not use any insurance.  - You can print the associated coupon and take it with your prescription to the pharmacy.  - You may also stop by our office during regular business hours and pick up a GoodRx coupon card.  - If you need your prescription sent electronically to a different pharmacy, notify our office through [facility] or by phone at [phone] option 4.

## 2020-09-01 ENCOUNTER — Encounter: Payer: Self-pay | Admitting: Dermatology

## 2020-10-19 ENCOUNTER — Encounter: Payer: Medicare Other | Admitting: Dermatology

## 2020-10-20 ENCOUNTER — Other Ambulatory Visit: Payer: Self-pay | Admitting: Internal Medicine

## 2021-02-15 ENCOUNTER — Ambulatory Visit (INDEPENDENT_AMBULATORY_CARE_PROVIDER_SITE_OTHER): Payer: Medicare Other | Admitting: Dermatology

## 2021-02-15 ENCOUNTER — Other Ambulatory Visit: Payer: Self-pay

## 2021-02-15 ENCOUNTER — Encounter: Payer: Self-pay | Admitting: Dermatology

## 2021-02-15 DIAGNOSIS — L570 Actinic keratosis: Secondary | ICD-10-CM

## 2021-02-15 DIAGNOSIS — L219 Seborrheic dermatitis, unspecified: Secondary | ICD-10-CM

## 2021-02-15 DIAGNOSIS — Z85828 Personal history of other malignant neoplasm of skin: Secondary | ICD-10-CM

## 2021-02-15 DIAGNOSIS — D1801 Hemangioma of skin and subcutaneous tissue: Secondary | ICD-10-CM

## 2021-02-15 DIAGNOSIS — L821 Other seborrheic keratosis: Secondary | ICD-10-CM

## 2021-02-15 DIAGNOSIS — L578 Other skin changes due to chronic exposure to nonionizing radiation: Secondary | ICD-10-CM

## 2021-02-15 DIAGNOSIS — D229 Melanocytic nevi, unspecified: Secondary | ICD-10-CM

## 2021-02-15 DIAGNOSIS — Z872 Personal history of diseases of the skin and subcutaneous tissue: Secondary | ICD-10-CM | POA: Diagnosis not present

## 2021-02-15 DIAGNOSIS — L814 Other melanin hyperpigmentation: Secondary | ICD-10-CM

## 2021-02-15 DIAGNOSIS — Z1283 Encounter for screening for malignant neoplasm of skin: Secondary | ICD-10-CM

## 2021-02-15 DIAGNOSIS — L820 Inflamed seborrheic keratosis: Secondary | ICD-10-CM | POA: Diagnosis not present

## 2021-02-15 MED ORDER — HYDROCORTISONE 2.5 % EX LOTN
TOPICAL_LOTION | CUTANEOUS | 6 refills | Status: AC
Start: 2021-02-15 — End: ?

## 2021-02-15 NOTE — Patient Instructions (Signed)

## 2021-02-15 NOTE — Progress Notes (Signed)
02/16/21 9:19 AM   Larry Shaffer 03/20/1941 [id number]  Referring provider:  Tracie Harrier, MD [address] [facility] [place],  [place] [address] Chief Complaint  Patient presents with   Neurogenic Blad    1 year follow     HPI: Larry Shaffer is a 80 y.o.male with a personal history of neurogenic bladder and chronic bacterial colonization, who presents today for 1 year follow-up.   He underwent urodynamics on 11/17/2018 that revealed neurogenic bladder.   His most recent creatinine was 1.8 which is up from previous 1.6.   He reports that he catheterizes 4-5 times. He states he has difficulty catheterizing sometimes. He may use 3 catheters to void he says it depends on the angle to which he inserts.   He questions if his back pain is in association with his neurogenic bladder. Since he had back surgery which relieved his sciatic pain the uropathy in his legs have eased, but he has experienced weakness in his muscles around his spine. He reports that he had an instance of constipation where he strained and felt like he pulled something, he had stitches at the time. He has spoke to Dr. Ginette Pitman about this and he received no opinion.   He reports that he recently had a bladder infection to which Dr. Ginette Pitman treated. He was not experiencing an back pain or fevers.   He is concerned with erectile dysfunction today. He has tried Viagra and Cialis and those failed but has tried nothing in at least 5 years.    PMH: Past Medical History:  Diagnosis Date   Actinic keratosis    Diabetes mellitus without complication ([place])    Gastric ulcer    Gastric ulcer    Hyperlipidemia    Hyperlipidemia    Obesity    Squamous cell carcinoma of skin 05/12/2013   mid sup forehead   Squamous cell carcinoma of skin 08/30/2020   left sup antihelix - insitu treated with [facility] 08/30/2020    Surgical History: Past Surgical History:  Procedure Laterality Date   COLONOSCOPY WITH  PROPOFOL N/A 12/02/2017   Procedure: COLONOSCOPY WITH PROPOFOL;  Surgeon: Manya Silvas, MD;  Location: [facility] ENDOSCOPY;  Service: Endoscopy;  Laterality: N/A;   ESOPHAGOGASTRODUODENOSCOPY (EGD) WITH PROPOFOL N/A 12/02/2017   Procedure: ESOPHAGOGASTRODUODENOSCOPY (EGD) WITH PROPOFOL;  Surgeon: Manya Silvas, MD;  Location[facility] ENDOSCOPY;  Service: Endoscopy;  Laterality: N/A;   KNEE ARTHROSCOPY Bilateral 2003, 2004   LUMBAR LAMINECTOMY/DECOMPRESSION MICRODISCECTOMY Left 12/24/2018   Procedure: L4-5 MICRODISCECTOMY, L5-S1 LUMBAR DECOMPRESSION;  Surgeon: Meade Maw, MD;  Location: [facility] ORS;  Service: Neurosurgery;  Laterality: Left;   tonsillectomty  1947   TONSILLECTOMY     TREATMENT FISTULA ANAL      Home Medications:  Allergies as of 02/16/2021   No Known Allergies      Medication List        Accurate as of February 16, 2021  9:19 AM. If you have any questions, ask your nurse or doctor.          atorvastatin 20 MG tablet Commonly known as: LIPITOR Take 1 tablet by mouth daily.   famciclovir 500 MG tablet Commonly known as: FAMVIR Take 1 tablet by mouth 2 (two) times daily as needed (fever blisters).   glipiZIDE 10 MG 24 hr tablet Commonly known as: GLUCOTROL XL Take 10 mg by mouth 2 (two) times daily.   hydrocortisone 2.5 % lotion Apply topically as directed. Apply at bedtime Tuesday,  Thursday, Saturday   losartan 25 MG tablet Commonly known as: COZAAR Take 1 tablet by mouth daily.   nystatin powder Commonly known as: MYCOSTATIN/NYSTOP Apply topically 2 (two) times daily.   pantoprazole 40 MG tablet Commonly known as: PROTONIX Take 40 mg by mouth daily.   sildenafil 20 MG tablet Commonly known as: REVATIO 3-5 tablets 30 min prior to intercourse as needed Started by: Hollice Espy, MD   sucralfate 1 g tablet Commonly known as: CARAFATE Take 1 g by mouth 2 (two) times daily.   tamsulosin 0.4 MG Caps capsule Commonly known as: FLOMAX Take 0.4  mg by mouth daily.   tiZANidine 2 MG tablet Commonly known as: ZANAFLEX Take 1 tablet (2 mg total) by mouth every 6 (six) hours as needed for muscle spasms.        Allergies: No Known Allergies  Family History: Family History  Problem Relation Age of Onset   Stroke Mother    Diabetes Mother    Heart disease Father    Cancer Sister     Social History:  reports that he has never smoked. He has never used smokeless tobacco. He reports current alcohol use. He reports that he does not use drugs.   Physical Exam: BP (!) 158/73   Pulse 78   Ht 5\' 8"  (1.727 m)   Wt 227 lb (103 kg)   BMI 34.52 kg/m   Constitutional:  Alert and oriented, No acute distress. HEENT: [place] AT, moist mucus membranes.  Trachea midline, no masses. Cardiovascular: No clubbing, cyanosis, or edema. Respiratory: Normal respiratory effort, no increased work of breathing. Skin: No rashes, bruises or suspicious lesions. Neurologic: Grossly intact, no focal deficits, moving all 4 extremities. Psychiatric: Normal mood and affect.  Laboratory Data:  Lab Results  Component Value Date   CREATININE 1.40 (H) 12/17/2018     Lab Results  Component Value Date   HGBA1C 7.7 (H) 12/24/2018    Assessment & Plan:    Neurogenic bladder  - Creatinine has increased from previous score of 1.6 to 1.8. If it continues to rise will plan to get upper tract imaging in the form of RUS earlier than a year.  - Continue to self cath -Encouraged to keep volumes under 500 cc, discussed strategies overnight including fluid management and shifting of nighttime cath.  -Not interested in changing cath supplies -No indication for continuation of flomax; ok to stop  2. Chronic bacterial colonization  - He was recently treated for infection by PCP, Dr. Ginette Pitman although he was asymptomatic  - Advised him again today to only seek treatment if he is experiencing true infection associated with fever, chills, sweats etc.   3.  Back muscle  weakness  - Recommended he follow-up with Dr Izora Ribas; consider PT?  4. Erectile dysfunction of vasculogenic origin  -We discussed the pathophysiology of erectile dysfunction today along with possible contributing factors. Discussed possible treatment options including PDE 5 inhibitors, vacuum erectile device, intracavernosal injection, MUSE, and placement of the inflatable or malleable penile prosthesis for refractory cases.  In terms of PDE 5 inhibitors, we discussed contraindications for this medication as well as common side effects. Patient was counseled on optimal use. All of his questions were answered in detail.  - Sildenafil prescribed today. If this is not effective, he will consider intra cavernosal injections (discussed our office protocol in this). He will let us know.   Follow-up in 1 year with RUS  I,Kailey Littlejohn,acting as a scribe for Hollice Espy, MD.,have  documented all relevant documentation on the behalf of Hollice Espy, MD,as directed by  Hollice Espy, MD while in the presence of Hollice Espy, MD.  I have reviewed the above documentation for accuracy and completeness, and I agree with the above.   Hollice Espy, MD  [facility] [address] [place], [place] [address] [phone]

## 2021-02-15 NOTE — Progress Notes (Signed)
Follow-Up Visit   Subjective  Larry Shaffer is a 80 y.o. male who presents for the following: Annual Exam (Hx SCC, AK's ). The patient presents for Total-Body Skin Exam (TBSE) for skin cancer screening and mole check.  The following portions of the chart were reviewed this encounter and updated as appropriate:   Tobacco  Allergies  Meds  Problems  Med Hx  Surg Hx  Fam Hx     Review of Systems:  No other skin or systemic complaints except as noted in HPI or Assessment and Plan.  Objective  Well appearing patient in no apparent distress; mood and affect are within normal limits.  A focused examination was performed including the face and scalp . Relevant physical exam findings are noted in the Assessment and Plan.  Face and scalp x 11 (11) Erythematous thin papules/macules with gritty scale.   R forearm x 3, R calf x 1, L med knee x 1 Erythematous keratotic or waxy stuck-on papule or plaque.   Face and chest Pink patches with greasy scale.    Assessment & Plan  AK (actinic keratosis) (11) Face and scalp x 11  Destruction of lesion - Face and scalp x 11 Complexity: simple   Destruction method: cryotherapy   Informed consent: discussed and consent obtained   Timeout:  patient name, date of birth, surgical site, and procedure verified Lesion destroyed using liquid nitrogen: Yes   Region frozen until ice ball extended beyond lesion: Yes   Outcome: patient tolerated procedure well with no complications   Post-procedure details: wound care instructions given    Inflamed seborrheic keratosis R forearm x 3, R calf x 1, L med knee x 1  Destruction of lesion - R forearm x 3, R calf x 1, L med knee x 1 Complexity: simple   Destruction method: cryotherapy   Informed consent: discussed and consent obtained   Timeout:  patient name, date of birth, surgical site, and procedure verified Lesion destroyed using liquid nitrogen: Yes   Region frozen until ice ball extended beyond  lesion: Yes   Outcome: patient tolerated procedure well with no complications   Post-procedure details: wound care instructions given    Seborrheic dermatitis Previously treating face - new involvement of chest Face and chest  Seborrheic Dermatitis  -  is a chronic persistent rash characterized by pinkness and scaling most commonly of the mid face but also can occur on the scalp (dandruff), ears; mid chest, mid back and groin.  It tends to be exacerbated by stress and cooler weather.  People who have neurologic disease may experience new onset or exacerbation of existing seborrheic dermatitis.  The condition is not curable but treatable and can be controlled.  Continue Ketoconazole 2% cream QOD alternating with HC 2.5% lotion QOD to aa's.   Related Medications hydrocortisone 2.5 % lotion Apply topically as directed. Apply at bedtime Tuesday, Thursday, Saturday  Lentigines - Scattered tan macules - Due to sun exposure - Benign-appearing, observe - Recommend daily broad spectrum sunscreen SPF 30+ to sun-exposed areas, reapply every 2 hours as needed. - Call for any changes  Seborrheic Keratoses - Stuck-on, waxy, tan-brown papules and/or plaques  - Benign-appearing - Discussed benign etiology and prognosis. - Observe - Call for any changes  Melanocytic Nevi - Tan-brown and/or pink-flesh-colored symmetric macules and papules - Benign appearing on exam today - Observation - Call clinic for new or changing moles - Recommend daily use of broad spectrum spf 30+ sunscreen to sun-exposed  areas.   Hemangiomas - Red papules - Discussed benign nature - Observe - Call for any changes  Actinic Damage - Chronic condition, secondary to cumulative UV/sun exposure - diffuse scaly erythematous macules with underlying dyspigmentation - Recommend daily broad spectrum sunscreen SPF 30+ to sun-exposed areas, reapply every 2 hours as needed.  - Staying in the shade or wearing long sleeves, sun  glasses (UVA+UVB protection) and wide brim hats (4-inch brim around the entire circumference of the hat) are also recommended for sun protection.  - Call for new or changing lesions.  History of Squamous Cell Carcinoma of the Skin - No evidence of recurrence today - No lymphadenopathy - Recommend regular full body skin exams - Recommend daily broad spectrum sunscreen SPF 30+ to sun-exposed areas, reapply every 2 hours as needed.  - Call if any new or changing lesions are noted between office visits  Skin cancer screening performed today.  Return in about 6 months (around 08/16/2021) for AK f/u .  Luther Redo, CMA, am acting as scribe for Sarina Ser, MD .  Documentation: I have reviewed the above documentation for accuracy and completeness, and I agree with the above.  Sarina Ser, MD

## 2021-02-16 ENCOUNTER — Encounter: Payer: Self-pay | Admitting: Urology

## 2021-02-16 ENCOUNTER — Ambulatory Visit (INDEPENDENT_AMBULATORY_CARE_PROVIDER_SITE_OTHER): Payer: Medicare Other | Admitting: Urology

## 2021-02-16 VITALS — BP 158/73 | HR 78 | Ht 68.0 in | Wt 227.0 lb

## 2021-02-16 DIAGNOSIS — N529 Male erectile dysfunction, unspecified: Secondary | ICD-10-CM | POA: Diagnosis not present

## 2021-02-16 DIAGNOSIS — N319 Neuromuscular dysfunction of bladder, unspecified: Secondary | ICD-10-CM

## 2021-02-16 MED ORDER — SILDENAFIL CITRATE 20 MG PO TABS: ORAL_TABLET | ORAL | 11 refills | Status: AC

## 2021-08-17 ENCOUNTER — Ambulatory Visit (INDEPENDENT_AMBULATORY_CARE_PROVIDER_SITE_OTHER): Payer: Medicare Other | Admitting: Dermatology

## 2021-08-17 DIAGNOSIS — L570 Actinic keratosis: Secondary | ICD-10-CM | POA: Diagnosis not present

## 2021-08-17 DIAGNOSIS — L578 Other skin changes due to chronic exposure to nonionizing radiation: Secondary | ICD-10-CM

## 2021-08-17 NOTE — Patient Instructions (Addendum)
In mid to late May100-6 mo Start 5-fluorouracil/calcipotriene cream twice a day for 7 days to affected areas including bilateral temples and one spot on forehead. Prescription previoulsly sent to [facility] and patient says he has cream at home ? ?5-fluorouracil/calcipotriene cream is is a type of field treatment used to treat precancers, thin skin cancers, and areas of sun damage. Expected reaction includes irritation and mild inflammation potentially progressing to more severe inflammation including redness, scaling, crusting and open sores/erosions.  If too much irritation occurs, ensure application of only a thin layer and decrease frequency of use to achieve a tolerable level of inflammation. Recommend applying Vaseline ointment to open sores as needed.  Minimize sun exposure while under treatment. Recommend daily broad spectrum sunscreen SPF 30+ to sun-exposed areas, reapply every 2 hours as needed.  ? ?   ?Cryotherapy Aftercare ? ?Wash gently with soap and water everyday.   ?Apply Vaseline and Band-Aid daily until healed.  ? ? ?If You Need Anything After Your Visit ? ?If you have any questions or concerns for your doctor, please call our main line at [phone] and press option 4 to reach your doctor's medical assistant. If no one answers, please leave a voicemail as directed and we will return your call as soon as possible. Messages left after 4 pm will be answered the following business day.  ? ?You may also send [place] a message via MyChart. We typically respond to MyChart messages within 1-2 business days. ? ?For prescription refills, please ask your pharmacy to contact our office. Our fax number is [phone]. ? ?If you have an urgent issue when the clinic is closed that cannot wait until the next business day, you can page your doctor at the number below.   ? ?Please note that while we do our best to be available for urgent issues outside of office hours, we are not available  24/7.  ? ?If you have an urgent issue and are unable to reach [place], you may choose to seek medical care at your doctor's office, retail clinic, urgent care center, or emergency room. ? ?If you have a medical emergency, please immediately call 911 or go to the emergency department. ? ?Pager Numbers ? ?- Dr. Nehemiah Massed: [phone] ? ?- Dr. Laurence Ferrari: [phone] ? ?- Dr. Nicole Kindred: [phone] ? ?In the event of inclement weather, please call our main line at [phone] for an update on the status of any delays or closures. ? ?Dermatology Medication Tips: ?Please keep the boxes that topical medications come in in order to help keep track of the instructions about where and how to use these. Pharmacies typically print the medication instructions only on the boxes and not directly on the medication tubes.  ? ?If your medication is too expensive, please contact our office at [phone] option 4 or send [place] a message through [place].  ? ?We are unable to tell what your co-pay for medications will be in advance as this is different depending on your insurance coverage. However, we may be able to find a substitute medication at lower cost or fill out paperwork to get insurance to cover a needed medication.  ? ?If a prior authorization is required to get your medication covered by your insurance company, please allow [place] 1-2 business days to complete this process. ? ?Drug prices often vary depending on where the prescription is filled and some pharmacies may offer cheaper prices. ? ?The website www.goodrx.com contains coupons for medications through different pharmacies. The prices  here do not account for what the cost may be with help from insurance (it may be cheaper with your insurance), but the website can give you the price if you did not use any insurance.  ?- You can print the associated coupon and take it with your prescription to the pharmacy.  ?- You may also stop by our office during regular business hours and pick up  a GoodRx coupon card.  ?- If you need your prescription sent electronically to a different pharmacy, notify our office through [facility] or by phone at [phone] option 4. ? ? ? ? ?Si Usted Necesita Algo Despu?s de Su Visita ? ?Tambi?n puede enviarnos un mensaje a trav?s de MyChart. Por lo general respondemos a los mensajes de MyChart en el transcurso de 1 a 2 d?as h?biles. ? ?Para renovar recetas, por favor pida a su farmacia que se ponga en contacto con nuestra oficina. Nuestro n?mero de fax es el [phone]. ? ?Si tiene un asunto urgente cuando la cl?nica est? cerrada y que no puede esperar hasta el siguiente d?a h?bil, puede llamar/localizar a su doctor(a) al n?mero que aparece a continuaci?n.  ? ?Por favor, tenga en cuenta que aunque hacemos todo lo posible para estar disponibles para asuntos urgentes fuera del horario de oficina, no estamos disponibles las 24 horas del d?a, los 7 d?as de la semana.  ? ?Si tiene un problema urgente y no puede comunicarse con nosotros, puede optar por buscar atenci?n m?dica  en el consultorio de su doctor(a), en una cl?nica privada, en un centro de atenci?n urgente o en una sala de emergencias. ? ?Si tiene Engineer, maintenance (IT) m?dica, por favor llame inmediatamente al 911 o vaya a la sala de emergencias. ? ?N?meros de b?per ? ?- Dr. Nehemiah Massed: [phone] ? ?- Dra. Moye: [phone] ? ?- Dra. Nicole Kindred: [phone] ? ?En caso de inclemencias del tiempo, por favor llame a nuestra l?nea principal al [phone] para una actualizaci?n sobre el estado de cualquier retraso o cierre. ? ?Consejos para la medicaci?n en dermatolog?a: ?Por favor, guarde las cajas en las que vienen los medicamentos de uso t?pico para ayudarle a seguir las instrucciones sobre d?nde y c?mo usarlos. Las farmacias generalmente imprimen las instrucciones del medicamento s?lo en las cajas y no directamente en los tubos del [place].  ? ?Si su medicamento es muy caro, por favor, p?ngase en contacto  con Zigmund Daniel llamando al [phone] y presione la opci?n 4 o env?enos un mensaje a trav?s de MyChart.  ? ?No podemos decirle cu?l ser? su copago por los medicamentos por adelantado ya que esto es diferente dependiendo de la cobertura de su seguro. Sin embargo, es posible que podamos encontrar un medicamento sustituto a Electrical engineer un formulario para que el seguro cubra el medicamento que se considera necesario.  ? ?Si se requiere Ardelia Mems autorizaci?n previa para que su compa??a de seguros [place] su medicamento, por favor perm?tanos de 1 a 2 d?as h?biles para completar este proceso. ? ?Los precios de los medicamentos var?an con frecuencia dependiendo del Environmental consultant de d?nde se surte la receta y alguna farmacias pueden ofrecer precios m?s baratos. ? ?El sitio web www.goodrx.com tiene cupones para medicamentos de Airline pilot. Los precios aqu? no tienen en cuenta lo que podr?a costar con la ayuda del seguro (puede ser m?s barato con su seguro), pero el sitio web puede darle el precio si no utiliz? ning?n seguro.  ?- Puede imprimir el cup?n correspondiente y llevarlo con su receta a la  farmacia.  ?- Tambi?n puede pasar por nuestra oficina durante el horario de atenci?n regular y recoger una tarjeta de cupones de GoodRx.  ?- Si necesita que su receta se env?e electr?nicamente a Chiropodist, informe a nuestra oficina a trav?s de MyChart de [place] o por tel?fono llamando al [phone] y presione la opci?n 4.  ?

## 2021-08-17 NOTE — Progress Notes (Signed)
? ?Follow-Up Visit ?  ?Subjective  ?Larry Shaffer is a 81 y.o. male who presents for the following: AK f/u (Face, scalp, 55mf/u). ?The patient has spots, moles and lesions to be evaluated, some may be new or changing and the patient has concerns that these could be cancer. ? ?The following portions of the chart were reviewed this encounter and updated as appropriate:  ? Tobacco  Allergies  Meds  Problems  Med Hx  Surg Hx  Fam Hx   ?  ?Review of Systems:  No other skin or systemic complaints except as noted in HPI or Assessment and Plan. ? ?Objective  ?Well appearing patient in no apparent distress; mood and affect are within normal limits. ? ?A focused examination was performed including face, scalp, ears. Relevant physical exam findings are noted in the Assessment and Plan. ? ?face/scalp x 9 (9) ?Pink scaly macules ? ? ?Assessment & Plan  ? ?Actinic Damage - Severe, confluent actinic changes with pre-cancerous actinic keratoses  ?- Severe, chronic, not at goal, secondary to cumulative UV radiation exposure over time ?- diffuse scaly erythematous macules and papules with underlying dyspigmentation ?- Discussed Prescription "Field Treatment" for Severe, Chronic Confluent Actinic Changes with Pre-Cancerous Actinic Keratoses ?Field treatment involves treatment of an entire area of skin that has confluent Actinic Changes (Sun/ Ultraviolet light damage) and PreCancerous Actinic Keratoses by method of PhotoDynamic Therapy (PDT) and/or prescription Topical Chemotherapy agents such as 5-fluorouracil, 5-fluorouracil/calcipotriene, and/or imiquimod.  The purpose is to decrease the number of clinically evident and subclinical PreCancerous lesions to prevent progression to development of skin cancer by chemically destroying early precancer changes that may or may not be visible.  It has been shown to reduce the risk of developing skin cancer in the treated area. As a result of treatment, redness, scaling, crusting, and  open sores may occur during treatment course. One or more than one of these methods may be used and may have to be used several times to control, suppress and eliminate the PreCancerous changes. Discussed treatment course, expected reaction, and possible side effects. ?- Recommend daily broad spectrum sunscreen SPF 30+ to sun-exposed areas, reapply every 2 hours as needed.  ?- Staying in the shade or wearing long sleeves, sun glasses (UVA+UVB protection) and wide brim hats (4-inch brim around the entire circumference of the hat) are also recommended. ?- Call for new or changing lesions.  ?-- Start 5-fluorouracil/calcipotriene cream twice a day for 7 days to affected areas including bil temples and one spot on forehead. [facility] [facility] [place] [facility] [place] cream [place]. Patient advised they will receive an email to purchase the medication online and have it sent to their home. Patient provided with handout reviewing treatment course and side effects and advised to call or message [place] MyChart with any concerns.  ? ?AK (actinic keratosis) (9) ?face/scalp x 9 ? ?Destruction of lesion - face/scalp x 9 ?Complexity: simple   ?Destruction method: cryotherapy   ?Informed consent: discussed and consent obtained   ?Timeout:  patient name, date of birth, surgical site, and procedure verified ?Lesion destroyed using liquid nitrogen: Yes   ?Region frozen until ice ball extended beyond lesion: Yes   ?Outcome: patient tolerated procedure well with no complications   ?Post-procedure details: wound care instructions given   ? ? ?Return for 4-6 months AK f/u . ? ?I, SOthelia Pulling RMA, am acting as scribe for DSarina Ser MD . ?Documentation: I have  reviewed the above documentation for accuracy and completeness, and I agree with the above. ? ?Sarina Ser, MD ? ?

## 2021-08-26 ENCOUNTER — Encounter: Payer: Self-pay | Admitting: Dermatology

## 2021-12-20 ENCOUNTER — Ambulatory Visit: Payer: Medicare Other | Admitting: Dermatology

## 2022-01-24 ENCOUNTER — Other Ambulatory Visit: Payer: Self-pay | Admitting: Urology

## 2022-01-31 ENCOUNTER — Encounter: Payer: Self-pay | Admitting: Urology

## 2022-02-20 ENCOUNTER — Ambulatory Visit: Payer: Medicare Other | Admitting: Urology

## 2022-05-22 ENCOUNTER — Telehealth: Payer: Self-pay | Admitting: *Deleted

## 2022-05-22 NOTE — Telephone Encounter (Signed)
Pt calling asking for order to [facility] for catheters. Pt has moved from [place] to [place] on 10/12/2021. Pt states he has been a patient of Dr. Erlene Quan for years and she diagnosed him with neurogenic bladder. Pt states he is still trying to find dr's in [place]. Pt asking for order to be sent ASAP due to him running low on catheters, I advised pt we would need a recent office note to provide documentation to cover the catheters. Pt would like to see if Dr. Erlene Quan can help? Pt last office visit 02/16/2021

## 2022-05-22 NOTE — Telephone Encounter (Signed)
If the insurance provider is willing to accept the note from 02/16/2021, I am happy to sign an order as this is a chronic irreversible condition.  Unfortunately, because he lives out of state and I do not have a license to practice out of state, will be able to provide any additional updated office notes.  Hollice Espy, MD

## 2022-05-23 NOTE — Telephone Encounter (Signed)
Spoke with patient. He has an appointment with PA in urology office in February and then seen another provider in May. Advised we will try and help and hopefully insurance will approve the request and authorization. Updated address and verified he has Medicare A and B, [employer] [place] order filled out by [employer] and faxed today.

## 2022-05-23 NOTE — Telephone Encounter (Signed)
Left message to call back to discuss, Need to get updated home address and verify insurance information also. Since Dr Erlene Quan is not in the office I will ask another provider to sign on her behalf

## 2022-05-25 NOTE — Telephone Encounter (Signed)
Received fax from [place] note needs to be within 12 months of 05/25/22 date and LOV was 01/2021. Called patient and left detailed message on voicemail (ok per DPR on file) letting him know unfortunately we will not be able to help and to try and call the office he has appointment with in May and see if they can help him sooner.

## 2023-07-02 ENCOUNTER — Telehealth: Payer: Self-pay | Admitting: Neurosurgery

## 2023-07-02 NOTE — Telephone Encounter (Signed)
 Patient had surgery with Dr.Yarbrough on 12/24/2018 and has an upcoming MRI scheduled for 07/17/2023. He wants to be sure there is no metal from his surgery that would interfere with him having an MRI. Please advise.
